# Patient Record
Sex: Female | Born: 1935 | Race: White | Hispanic: No | Smoking: Former smoker
Health system: Southern US, Community
[De-identification: ages and names within clinical notes are randomized; demographics above are authoritative.]

## PROBLEM LIST (undated history)

## (undated) ENCOUNTER — Emergency Department (HOSPITAL_COMMUNITY): Admission: EM | Payer: PRIVATE HEALTH INSURANCE | Source: Home / Self Care

## (undated) DIAGNOSIS — F329 Major depressive disorder, single episode, unspecified: Secondary | ICD-10-CM

## (undated) DIAGNOSIS — M1990 Unspecified osteoarthritis, unspecified site: Secondary | ICD-10-CM

## (undated) DIAGNOSIS — R296 Repeated falls: Secondary | ICD-10-CM

## (undated) DIAGNOSIS — J449 Chronic obstructive pulmonary disease, unspecified: Secondary | ICD-10-CM

## (undated) DIAGNOSIS — R0602 Shortness of breath: Secondary | ICD-10-CM

## (undated) DIAGNOSIS — I110 Hypertensive heart disease with heart failure: Secondary | ICD-10-CM

## (undated) DIAGNOSIS — I739 Peripheral vascular disease, unspecified: Secondary | ICD-10-CM

## (undated) DIAGNOSIS — I509 Heart failure, unspecified: Secondary | ICD-10-CM

## (undated) DIAGNOSIS — E039 Hypothyroidism, unspecified: Secondary | ICD-10-CM

## (undated) DIAGNOSIS — F0390 Unspecified dementia without behavioral disturbance: Secondary | ICD-10-CM

## (undated) DIAGNOSIS — I639 Cerebral infarction, unspecified: Secondary | ICD-10-CM

## (undated) DIAGNOSIS — J45909 Unspecified asthma, uncomplicated: Secondary | ICD-10-CM

## (undated) DIAGNOSIS — C4490 Unspecified malignant neoplasm of skin, unspecified: Secondary | ICD-10-CM

## (undated) DIAGNOSIS — Z9981 Dependence on supplemental oxygen: Secondary | ICD-10-CM

## (undated) DIAGNOSIS — F339 Major depressive disorder, recurrent, unspecified: Secondary | ICD-10-CM

## (undated) DIAGNOSIS — E7800 Pure hypercholesterolemia, unspecified: Secondary | ICD-10-CM

## (undated) DIAGNOSIS — E785 Hyperlipidemia, unspecified: Secondary | ICD-10-CM

## (undated) DIAGNOSIS — K219 Gastro-esophageal reflux disease without esophagitis: Secondary | ICD-10-CM

## (undated) DIAGNOSIS — I10 Essential (primary) hypertension: Secondary | ICD-10-CM

## (undated) DIAGNOSIS — I2510 Atherosclerotic heart disease of native coronary artery without angina pectoris: Secondary | ICD-10-CM

## (undated) DIAGNOSIS — M109 Gout, unspecified: Secondary | ICD-10-CM

## (undated) DIAGNOSIS — J309 Allergic rhinitis, unspecified: Secondary | ICD-10-CM

## (undated) DIAGNOSIS — G2581 Restless legs syndrome: Secondary | ICD-10-CM

## (undated) DIAGNOSIS — J189 Pneumonia, unspecified organism: Secondary | ICD-10-CM

## (undated) DIAGNOSIS — I209 Angina pectoris, unspecified: Secondary | ICD-10-CM

## (undated) DIAGNOSIS — R42 Dizziness and giddiness: Secondary | ICD-10-CM

## (undated) DIAGNOSIS — I5030 Unspecified diastolic (congestive) heart failure: Secondary | ICD-10-CM

## (undated) DIAGNOSIS — I219 Acute myocardial infarction, unspecified: Secondary | ICD-10-CM

## (undated) DIAGNOSIS — F3289 Other specified depressive episodes: Secondary | ICD-10-CM

## (undated) DIAGNOSIS — F32A Depression, unspecified: Secondary | ICD-10-CM

## (undated) HISTORY — DX: Major depressive disorder, single episode, unspecified: F32.9

## (undated) HISTORY — DX: Major depressive disorder, recurrent, unspecified: F33.9

## (undated) HISTORY — DX: Peripheral vascular disease, unspecified: I73.9

## (undated) HISTORY — PX: TUMOR EXCISION: SHX421

## (undated) HISTORY — DX: Unspecified osteoarthritis, unspecified site: M19.90

## (undated) HISTORY — DX: Restless legs syndrome: G25.81

## (undated) HISTORY — DX: Gout, unspecified: M10.9

## (undated) HISTORY — PX: TYMPANOPLASTY: SHX33

## (undated) HISTORY — DX: Hypertensive heart disease with heart failure: I11.0

## (undated) HISTORY — DX: Unspecified dementia, unspecified severity, without behavioral disturbance, psychotic disturbance, mood disturbance, and anxiety: F03.90

## (undated) HISTORY — PX: TONSILLECTOMY: SUR1361

## (undated) HISTORY — PX: CATARACT EXTRACTION W/ INTRAOCULAR LENS  IMPLANT, BILATERAL: SHX1307

## (undated) HISTORY — DX: Heart failure, unspecified: I50.9

## (undated) HISTORY — DX: Unspecified diastolic (congestive) heart failure: I50.30

## (undated) HISTORY — DX: Allergic rhinitis, unspecified: J30.9

## (undated) HISTORY — DX: Gastro-esophageal reflux disease without esophagitis: K21.9

## (undated) HISTORY — DX: Chronic obstructive pulmonary disease, unspecified: J44.9

## (undated) HISTORY — DX: Other specified depressive episodes: F32.89

## (undated) HISTORY — DX: Hyperlipidemia, unspecified: E78.5

## (undated) HISTORY — DX: Atherosclerotic heart disease of native coronary artery without angina pectoris: I25.10

## (undated) HISTORY — PX: SKIN CANCER EXCISION: SHX779

---

## 1998-04-11 ENCOUNTER — Inpatient Hospital Stay (HOSPITAL_COMMUNITY): Admission: RE | Admit: 1998-04-11 | Discharge: 1998-04-17 | Payer: Self-pay | Admitting: Neurosurgery

## 1998-04-11 ENCOUNTER — Encounter: Payer: Self-pay | Admitting: Neurosurgery

## 2000-01-02 ENCOUNTER — Encounter: Payer: Self-pay | Admitting: Cardiology

## 2000-01-02 ENCOUNTER — Encounter: Admission: RE | Admit: 2000-01-02 | Discharge: 2000-01-02 | Payer: Self-pay | Admitting: Cardiology

## 2000-07-01 ENCOUNTER — Other Ambulatory Visit: Admission: RE | Admit: 2000-07-01 | Discharge: 2000-07-01 | Payer: Self-pay | Admitting: *Deleted

## 2001-04-21 ENCOUNTER — Encounter: Payer: Self-pay | Admitting: Cardiology

## 2001-04-21 ENCOUNTER — Encounter: Admission: RE | Admit: 2001-04-21 | Discharge: 2001-04-21 | Payer: Self-pay | Admitting: Cardiology

## 2001-05-06 ENCOUNTER — Encounter: Payer: Self-pay | Admitting: Neurosurgery

## 2001-05-06 ENCOUNTER — Encounter: Admission: RE | Admit: 2001-05-06 | Discharge: 2001-05-06 | Payer: Self-pay | Admitting: Neurosurgery

## 2001-05-20 ENCOUNTER — Encounter: Payer: Self-pay | Admitting: Neurosurgery

## 2001-05-20 ENCOUNTER — Encounter: Admission: RE | Admit: 2001-05-20 | Discharge: 2001-05-20 | Payer: Self-pay | Admitting: Neurosurgery

## 2001-06-03 ENCOUNTER — Encounter: Payer: Self-pay | Admitting: Neurosurgery

## 2001-06-03 ENCOUNTER — Encounter: Admission: RE | Admit: 2001-06-03 | Discharge: 2001-06-03 | Payer: Self-pay | Admitting: Neurosurgery

## 2002-06-02 ENCOUNTER — Other Ambulatory Visit: Admission: RE | Admit: 2002-06-02 | Discharge: 2002-06-02 | Payer: Self-pay | Admitting: Obstetrics and Gynecology

## 2003-10-17 ENCOUNTER — Ambulatory Visit (HOSPITAL_COMMUNITY): Admission: RE | Admit: 2003-10-17 | Discharge: 2003-10-17 | Payer: Self-pay | Admitting: Cardiology

## 2004-04-01 ENCOUNTER — Encounter: Admission: RE | Admit: 2004-04-01 | Discharge: 2004-04-01 | Payer: Self-pay | Admitting: Cardiology

## 2006-02-12 ENCOUNTER — Encounter: Admission: RE | Admit: 2006-02-12 | Discharge: 2006-02-12 | Payer: Self-pay | Admitting: General Surgery

## 2006-02-28 ENCOUNTER — Encounter: Admission: RE | Admit: 2006-02-28 | Discharge: 2006-02-28 | Payer: Self-pay | Admitting: General Surgery

## 2006-05-14 ENCOUNTER — Inpatient Hospital Stay (HOSPITAL_COMMUNITY): Admission: EM | Admit: 2006-05-14 | Discharge: 2006-05-17 | Payer: Self-pay | Admitting: Emergency Medicine

## 2006-07-18 ENCOUNTER — Emergency Department (HOSPITAL_COMMUNITY): Admission: EM | Admit: 2006-07-18 | Discharge: 2006-07-18 | Payer: Self-pay | Admitting: Emergency Medicine

## 2006-08-05 ENCOUNTER — Inpatient Hospital Stay (HOSPITAL_COMMUNITY): Admission: EM | Admit: 2006-08-05 | Discharge: 2006-08-11 | Payer: Self-pay | Admitting: Emergency Medicine

## 2006-10-16 ENCOUNTER — Encounter (INDEPENDENT_AMBULATORY_CARE_PROVIDER_SITE_OTHER): Payer: Self-pay | Admitting: *Deleted

## 2006-10-16 ENCOUNTER — Ambulatory Visit (HOSPITAL_COMMUNITY): Admission: RE | Admit: 2006-10-16 | Discharge: 2006-10-16 | Payer: Self-pay | Admitting: *Deleted

## 2007-02-27 ENCOUNTER — Encounter: Admission: RE | Admit: 2007-02-27 | Discharge: 2007-02-27 | Payer: Self-pay | Admitting: Cardiology

## 2008-09-28 ENCOUNTER — Encounter: Admission: RE | Admit: 2008-09-28 | Discharge: 2008-09-28 | Payer: Self-pay | Admitting: Internal Medicine

## 2009-01-11 ENCOUNTER — Encounter
Admission: RE | Admit: 2009-01-11 | Discharge: 2009-03-02 | Payer: Self-pay | Admitting: Physical Medicine & Rehabilitation

## 2009-01-12 ENCOUNTER — Ambulatory Visit: Payer: Self-pay | Admitting: Physical Medicine & Rehabilitation

## 2009-01-26 ENCOUNTER — Ambulatory Visit: Payer: Self-pay | Admitting: Physical Medicine & Rehabilitation

## 2009-02-20 ENCOUNTER — Ambulatory Visit: Payer: Self-pay | Admitting: Physical Medicine & Rehabilitation

## 2009-04-05 ENCOUNTER — Encounter
Admission: RE | Admit: 2009-04-05 | Discharge: 2009-04-24 | Payer: Self-pay | Admitting: Physical Medicine & Rehabilitation

## 2009-04-06 ENCOUNTER — Ambulatory Visit: Payer: Self-pay | Admitting: Physical Medicine & Rehabilitation

## 2009-04-18 ENCOUNTER — Inpatient Hospital Stay (HOSPITAL_COMMUNITY): Admission: EM | Admit: 2009-04-18 | Discharge: 2009-04-24 | Payer: Self-pay | Admitting: Emergency Medicine

## 2009-04-20 ENCOUNTER — Encounter (INDEPENDENT_AMBULATORY_CARE_PROVIDER_SITE_OTHER): Payer: Self-pay | Admitting: Internal Medicine

## 2010-04-04 ENCOUNTER — Inpatient Hospital Stay (HOSPITAL_COMMUNITY): Admission: EM | Admit: 2010-04-04 | Discharge: 2010-04-06 | Payer: Self-pay | Source: Home / Self Care

## 2010-04-04 LAB — DIFFERENTIAL
Basophils Absolute: 0.1 10*3/uL (ref 0.0–0.1)
Basophils Relative: 1 % (ref 0–1)
Eosinophils Absolute: 0.3 10*3/uL (ref 0.0–0.7)
Eosinophils Relative: 4 % (ref 0–5)
Lymphs Abs: 2 10*3/uL (ref 0.7–4.0)

## 2010-04-04 LAB — CBC
Hemoglobin: 10.6 g/dL — ABNORMAL LOW (ref 12.0–15.0)
MCV: 87.7 fL (ref 78.0–100.0)
RBC: 3.9 MIL/uL (ref 3.87–5.11)
WBC: 8 10*3/uL (ref 4.0–10.5)

## 2010-04-04 LAB — POCT CARDIAC MARKERS
CKMB, poc: 1.5 ng/mL (ref 1.0–8.0)
Myoglobin, poc: 125 ng/mL (ref 12–200)
Troponin i, poc: <0.05 ng/mL (ref 0.00–0.09)
Troponin i, poc: <0.05 ng/mL (ref 0.00–0.09)

## 2010-04-04 LAB — POCT I-STAT, CHEM 8
Chloride: 104 mEq/L (ref 96–112)
HCT: 36 % (ref 36.0–46.0)
Hemoglobin: 12.2 g/dL (ref 12.0–15.0)

## 2010-04-04 LAB — LACTIC ACID, PLASMA: Lactic Acid, Venous: 1.9 mmol/L (ref 0.5–2.2)

## 2010-04-04 LAB — COMPREHENSIVE METABOLIC PANEL
Albumin: 4 g/dL (ref 3.5–5.2)
BUN: 18 mg/dL (ref 6–23)
CO2: 26 mEq/L (ref 19–32)
Chloride: 101 mEq/L (ref 96–112)
GFR calc Af Amer: 58 mL/min — ABNORMAL LOW (ref >60)
GFR calc non Af Amer: 48 mL/min — ABNORMAL LOW (ref >60)
Glucose, Bld: 108 mg/dL — ABNORMAL HIGH (ref 70–99)
Potassium: 4.5 mEq/L (ref 3.5–5.1)
Sodium: 137 mEq/L (ref 135–145)
Total Bilirubin: 0.3 mg/dL (ref 0.3–1.2)

## 2010-04-04 LAB — TYPE AND SCREEN: Antibody Screen: POSITIVE

## 2010-04-04 LAB — PROTIME-INR: INR: 0.91 (ref 0.00–1.49)

## 2010-04-05 LAB — BASIC METABOLIC PANEL
BUN: 13 mg/dL (ref 6–23)
CO2: 28 mEq/L (ref 19–32)
Chloride: 102 mEq/L (ref 96–112)
Creatinine, Ser: 0.94 mg/dL (ref 0.4–1.2)
GFR calc Af Amer: >60 mL/min (ref >60)
Glucose, Bld: 112 mg/dL — ABNORMAL HIGH (ref 70–99)

## 2010-04-18 NOTE — H&P (Signed)
NAME:  [name], [name]               ACCOUNT NO.:  [id number]  MEDICAL RECORD NO.:  [id number]          PATIENT TYPE:  EMS  LOCATION:  MAJO                         FACILITY:  [facility]  PHYSICIAN:  Almond Lint, MD       DATE OF BIRTH:  1926/10/08  DATE OF ADMISSION:  04/04/2010 DATE OF DISCHARGE:                             HISTORY & PHYSICAL   CHIEF COMPLAINT:  Fall, silver trauma  HISTORY OF PRESENT ILLNESS:  [name] is an 77 year old female who fell today while at home.  She had gone to the bathroom and left her walker there, which she normally does walk with.  She started going the opposite direction that she normally goes and picked up her speed and started running.  She was unable to stop and ran into the corner of the wall. Her son immediately heard her hit and came to check on her, and he noticed faint bruising on her face.  This occurred around noon.  After several hours, the bruising was dramatically worse and so they contacted their primary care physician.  This was Dr. Ronne Binning.  He saw her and got some facial films and referred her to the emergency department.  Her pain is 6/10.  She denies other complaint.  She denies loss of consciousness.  She does state she has had bilateral leg pain for several days to weeks.  PAST MEDICAL HISTORY:  Significant for coronary artery disease; hypertension; questionable COPD; diverticulosis; hypothyroid; dementia; and a history of pneumonia last February which required hospitalization.  PAST SURGICAL HISTORY:  Neck surgery and mastoid surgery.  SOCIAL HISTORY:  She is a former smoker.  Does not use drugs or alcohol. Lives with her son.  ALLERGIES:  None.  MEDICATIONS:  Allopurinol, Aricept, aspirin, Diflucan, Elocon, isosorbide mononitrate, Lasix, metoprolol, Neurontin, potassium chloride, prednisone, Prevacid, Prozac, Risperdal simvastatin, Spiriva, Synthroid, Tussionex, Valium, and Vicodin.  PRIMARY MD:  Dr.  Ronne Binning.  REVIEW OF SYSTEMS:  GENERAL:  Unable to be obtained accurately due to dementia.  She perseverates significantly. VITAL SIGNS:  Temperature 98.4, pulse 63, respiratory rate 14, blood pressure 142/52, sats are 98%. SKIN:  She has swelling and bruising over the bilateral face. HEENT:  Otherwise, normocephalic and atraumatic.  Eyes:  Pupils equal, round, and reactive to light.  The right eye does have some swelling, but is easily able to visualize the pupil.  Ears:  External ears are without trauma.  There is no blood in the auditory canal.  Face:  The mid face is swollen and tender. NECK:  Nontender. LUNGS:  Clear bilaterally, but decreased breath sounds throughout. There is no wheezing, rales, or rhonchi. HEART:  Regular rate and rhythm.  No murmurs. ABDOMEN:  Soft, nontender, nondistended. PELVIS:  Stable and nontender. MUSCULOSKELETAL:  Her right thigh is painful.  Otherwise, her extremities are warm and well-perfused, and she is able to straighten them.  Her back is nontender. NEURO:  She is not entirely oriented to the situation, but does redirect.  She does perseverate and she appears to have good motor and sensory function throughout.  LABS:  Sodium 134, potassium 4.5, chloride 104,  CO2 26, BUN 22, creatinine 1.3, glucose 104.  White count 8, hemoglobin/hematocrit 10.6 and 34.2, platelets 193,000.  Chest x-ray shows left basilar atelectasis.  Pelvis is negative for fracture.  CT scan of  brain is negative for intracranial abnormality.  Neck is negative for fracture.  Face shows right orbital floor fracture, right lateral maxillary wall fracture, subcutaneous air in the face and left face.  Of note, the cervical spine did show advanced degenerative cervical spondylosis and kyphosis with degenerative anterolisthesis at C3-4 and C4-5.  Of note, with the facial CT, the globe on the right is intact although there is right orbital emphysema.  The chest shows  nondisplaced left 8th and 9th rib fracture, and the left 9th rib appears to have a possible lytic lesion.  The abdomen and pelvis shows no traumatic findings, diverticulosis, and there are benign calcifications in the liver.  CONSULTANTS:  We will consult ENT for a non-urgent consult.  ASSESSMENT AND PLAN:  [name] is an 77 year old female status post fall with left 8th rib fracture and 9th rib fracture, questionable lytic lesion in the left rib and facial fractures with air in the right orbit and bilateral face.  She will be admitted to the hospital for evaluation.  We will recheck right rib films in the a.m. and check a right femur film.  We will have an ENT consult, pulmonary toilet, and pain control.     Almond Lint, MD     FB/MEDQ  D:  04/04/2010  T:  04/05/2010  Job:  [id number]  Electronically Signed by Almond Lint MD on 04/18/2010 12:17:11 PM

## 2010-04-26 NOTE — Discharge Summary (Signed)
[name], [name]               ACCOUNT NO.:  [id number]  MEDICAL RECORD NO.:  [id number]          PATIENT TYPE:  INP  LOCATION:  5121                         FACILITY:  [facility]  PHYSICIAN:  Wilmon Arms. Corliss Skains, M.D. DATE OF BIRTH:  November 19, 1926  DATE OF ADMISSION:  04/04/2010 DATE OF DISCHARGE:  04/06/2010                              DISCHARGE SUMMARY   DISCHARGE DIAGNOSES: 1. Fall. 2. Right orbit maxillary sinus fractures. 3. Left ninth rib fracture. 4. Dementia. 5. Coronary artery disease. 6. Hypertension. 7. Chronic obstructive pulmonary disease. 8. Diverticulosis. 9. Hypothyroidism. 10.Dementia. 11.Gastroesophageal reflux disease. 12.Dyslipidemia.  CONSULTANTS:  Dr. Annalee Genta for Head and Neck Surgery.  PROCEDURES:  None.  HISTORY OF PRESENT ILLNESS:  This is an 77 year old white female who was at home.  She left her walker in the bathroom and was walking without it.  For some reason, she peaked up speed and started running and was unable to stop and ran into the corner of the wall and fell.  When the bruising on her face became rather traumatic, they brought her in to see her primary care provider.  Some facial films there prompted transfer to the emergency department where the facial fractures were diagnosed and a possible left rib fracture was identified.  She was admitted for head and neck surgery consults and observation.  HOSPITAL COURSE:  Dr. Annalee Genta saw the patient and recommended no nose blowing, but no further treatment was required.  Followup in his office is going to be on an as-needed basis.  Her medical problems all remained stable while she was here and she was able to be mobilized with physical therapy who agreed with discharge back home with the son with 24-hour supervision.  The son is in agreement with this and she was transferred there in good condition in his care.  DISCHARGE MEDICATIONS:  No new medications were added during this admission.   She may resume all of her home medications which include: 1. Albuterol neb twice daily as needed for shortness of breath. 2. Allopurinol 100 mg daily. 3. Aspirin 325 mg daily. 4. Aricept 5 mg daily. 5. Diflucan 150 mg daily. 6. Lasix 40 mg daily. 7. Metoprolol 12.5 mg twice daily. 8. Neurontin 300 mg 3 times daily. 9. Prevacid 30 mg daily. 10.Prozac 20 mg daily. 11.Risperdal 1 mg daily at bedtime. 12.Simvastatin 10 mg daily at bedtime. 13.Spiriva 18 mcg inhaled daily. 14.Synthroid 50 mcg daily. 15.Vitamin D2 50,000 international units by mouth twice daily. 16.Valium 5 mg by mouth daily as needed for nerves. 17.Vicodin ES 7.5/750 one tablet by mouth 4 times daily as needed for     pain.  FOLLOWUP:  The patient will follow up with her primary care provider. Follow up with both Dr. Annalee Genta and the Trauma Service will be on an as-needed basis.     Earney Hamburg, P.A.   ______________________________ Wilmon Arms. Corliss Skains, M.D.    MJ/MEDQ  D:  04/06/2010  T:  04/06/2010  Job:  [id number]  cc:   Dr. Ronne Binning  Electronically Signed by Charma Igo P.A. on 04/26/2010 11:06:19 AM Electronically Signed by Manus Rudd M.D. on 04/26/2010  06:36:56 PM

## 2010-04-27 NOTE — Consult Note (Signed)
  NAMEVERNADINE, [name]               ACCOUNT NO.:  [id number]  MEDICAL RECORD NO.:  [id number]          PATIENT TYPE:  INP  LOCATION:  5121                         FACILITY:  [facility]  PHYSICIAN:  Kinnie Scales. Annalee Genta, M.D.DATE OF BIRTH:  04/19/1926  DATE OF CONSULTATION:  04/05/2010 DATE OF DISCHARGE:                                CONSULTATION   BRIEF HISTORY:  The patient is an 77 year old white female who presented to the [facility] Emergency Department after suffering a fall. She struck her face and chest wall and was admitted via the Trauma Service for observation and management of her injuries.  There was no loss of consciousness at the time of her injury.  Minimal nasal bleeding and moderate amount of right facial swelling with some minimal pain. [name] dentures were intact.  There was no diplopia and no significant change over the first 12 hours of her admission.  A CT maxillofacial scan was performed and was reviewed in detail that showed nondisplaced fractures involving the right inferior orbital wall and right lateral maxillary sinus wall.  No significant fluid or blood within the maxillary sinus.  There was a moderate amount of soft tissue swelling and some emphysema overlying the right cheek.  No other facial injuries, fractures, or abnormalities were identified with the exception of a deviated nasal septum, which appears to be old in nature.  PHYSICAL EXAMINATION:  The patient is an 77 year old white female, mildly disoriented, but alert and awake.  She has a moderate amount of right facial swelling with ecchymosis involving the right cheek and periorbital region.  The extraocular mobility is intact with no evidence of diplopia or entrapment.  She is minimally tender over the right inferior orbital rim.  There was no evidence of palpable fracture.  Oral cavity shows no significant swelling, but ecchymosis within the right buccal space.  [name] upper  and lower dentures fit appropriately and there was no evidence of jaw fracture.  Normal mobility.  IMPRESSION: 1. Fall with multiple injuries. 2. Right facial trauma. 3. Nondisplaced right facial fractures with soft tissue ecchymosis and     emphysema.  ASSESSMENT AND PLAN:  The patient has evaluated in the hospital after suffering facial injuries.  Recommend fracture precautions including elevate head of bed, no nose blowing, frequent saline nasal spray and avoiding any additional trauma to the involved area.  Based on her history, examination and CT scan, it is unlikely that any surgical intervention will be required.  The fractures are nondisplaced and appears stable in nature.  Recommend follow up as outpatient as needed.  [name] symptoms will gradually diminish over the next several weeks, but would be glad to see her as an outpatient for further evaluation and workup or if there any significant change in her symptoms.          ______________________________ Kinnie Scales Annalee Genta, M.D.     DLS/MEDQ  D:  98/01/9146  T:  04/06/2010  Job:  [id number]  Electronically Signed by Osborn Coho M.D. on 04/27/2010 04:49:29 PM

## 2010-05-30 LAB — BLOOD GAS, ARTERIAL
FIO2: 0.28 %
O2 Content: 2 L/min
Patient temperature: 98.6
pH, Arterial: 7.44 — ABNORMAL HIGH (ref 7.350–7.400)

## 2010-05-30 LAB — BASIC METABOLIC PANEL
BUN: 11 mg/dL (ref 6–23)
BUN: 4 mg/dL — ABNORMAL LOW (ref 6–23)
BUN: 5 mg/dL — ABNORMAL LOW (ref 6–23)
CO2: 24 mEq/L (ref 19–32)
Calcium: 9.1 mg/dL (ref 8.4–10.5)
Chloride: 108 mEq/L (ref 96–112)
Chloride: 114 mEq/L — ABNORMAL HIGH (ref 96–112)
Chloride: 115 mEq/L — ABNORMAL HIGH (ref 96–112)
Creatinine, Ser: 0.71 mg/dL (ref 0.4–1.2)
Creatinine, Ser: 0.83 mg/dL (ref 0.4–1.2)
Creatinine, Ser: 0.85 mg/dL (ref 0.4–1.2)
GFR calc Af Amer: >60 mL/min (ref >60)
GFR calc non Af Amer: >60 mL/min (ref >60)
GFR calc non Af Amer: >60 mL/min (ref >60)
Glucose, Bld: 108 mg/dL — ABNORMAL HIGH (ref 70–99)
Glucose, Bld: 97 mg/dL (ref 70–99)
Potassium: 4.1 mEq/L (ref 3.5–5.1)

## 2010-05-30 LAB — URINE MICROSCOPIC-ADD ON

## 2010-05-30 LAB — CBC
HCT: 27.9 % — ABNORMAL LOW (ref 36.0–46.0)
HCT: 34.5 % — ABNORMAL LOW (ref 36.0–46.0)
Hemoglobin: 9.6 g/dL — ABNORMAL LOW (ref 12.0–15.0)
MCHC: 33.4 g/dL (ref 30.0–36.0)
MCHC: 33.6 g/dL (ref 30.0–36.0)
MCHC: 34.1 g/dL (ref 30.0–36.0)
MCV: 87.3 fL (ref 78.0–100.0)
MCV: 87.5 fL (ref 78.0–100.0)
MCV: 87.6 fL (ref 78.0–100.0)
MCV: 87.6 fL (ref 78.0–100.0)
MCV: 87.7 fL (ref 78.0–100.0)
Platelets: 121 10*3/uL — ABNORMAL LOW (ref 150–400)
Platelets: 126 10*3/uL — ABNORMAL LOW (ref 150–400)
Platelets: 160 10*3/uL (ref 150–400)
Platelets: 197 10*3/uL (ref 150–400)
RBC: 3.06 MIL/uL — ABNORMAL LOW (ref 3.87–5.11)
RBC: 3.18 MIL/uL — ABNORMAL LOW (ref 3.87–5.11)
RBC: 3.19 MIL/uL — ABNORMAL LOW (ref 3.87–5.11)
RBC: 3.94 MIL/uL (ref 3.87–5.11)
RDW: 14.6 % (ref 11.5–15.5)
RDW: 14.8 % (ref 11.5–15.5)
WBC: 11.3 10*3/uL — ABNORMAL HIGH (ref 4.0–10.5)
WBC: 16.2 10*3/uL — ABNORMAL HIGH (ref 4.0–10.5)
WBC: 6.9 10*3/uL (ref 4.0–10.5)
WBC: 7.7 10*3/uL (ref 4.0–10.5)
WBC: 8.6 10*3/uL (ref 4.0–10.5)

## 2010-05-30 LAB — DIFFERENTIAL
Basophils Relative: 0 % (ref 0–1)
Eosinophils Absolute: 0.1 10*3/uL (ref 0.0–0.7)
Eosinophils Absolute: 0.2 10*3/uL (ref 0.0–0.7)
Eosinophils Relative: 1 % (ref 0–5)
Eosinophils Relative: 3 % (ref 0–5)
Lymphocytes Relative: 18 % (ref 12–46)
Lymphs Abs: 1.1 10*3/uL (ref 0.7–4.0)
Lymphs Abs: 1.2 10*3/uL (ref 0.7–4.0)
Monocytes Absolute: 0.4 10*3/uL (ref 0.1–1.0)
Monocytes Absolute: 0.7 10*3/uL (ref 0.1–1.0)
Monocytes Relative: 4 % (ref 3–12)

## 2010-05-30 LAB — CARDIAC PANEL(CRET KIN+CKTOT+MB+TROPI)
CK, MB: 1.9 ng/mL (ref 0.3–4.0)
Relative Index: 0.9 (ref 0.0–2.5)
Relative Index: 7.6 — ABNORMAL HIGH (ref 0.0–2.5)
Relative Index: 8.3 — ABNORMAL HIGH (ref 0.0–2.5)
Total CK: 183 U/L — ABNORMAL HIGH (ref 7–177)
Total CK: 215 U/L — ABNORMAL HIGH (ref 7–177)
Troponin I: 1.54 ng/mL (ref 0.00–0.06)
Troponin I: 1.87 ng/mL (ref 0.00–0.06)

## 2010-05-30 LAB — COMPREHENSIVE METABOLIC PANEL
ALT: 15 U/L (ref 0–35)
AST: 19 U/L (ref 0–37)
Albumin: 2.9 g/dL — ABNORMAL LOW (ref 3.5–5.2)
Calcium: 8.9 mg/dL (ref 8.4–10.5)
Chloride: 112 mEq/L (ref 96–112)
Creatinine, Ser: 0.84 mg/dL (ref 0.4–1.2)
GFR calc Af Amer: >60 mL/min (ref >60)
Sodium: 141 mEq/L (ref 135–145)
Total Bilirubin: 0.5 mg/dL (ref 0.3–1.2)

## 2010-05-30 LAB — TSH: TSH: 0.712 u[IU]/mL (ref 0.350–4.500)

## 2010-05-30 LAB — URINALYSIS, ROUTINE W REFLEX MICROSCOPIC
Bilirubin Urine: NEGATIVE
Glucose, UA: NEGATIVE mg/dL
Hgb urine dipstick: NEGATIVE
Protein, ur: NEGATIVE mg/dL
Urobilinogen, UA: 0.2 mg/dL (ref 0.0–1.0)

## 2010-05-30 LAB — CK TOTAL AND CKMB (NOT AT ARMC): CK, MB: 1.8 ng/mL (ref 0.3–4.0)

## 2010-05-30 LAB — BRAIN NATRIURETIC PEPTIDE
Pro B Natriuretic peptide (BNP): 1493 pg/mL — ABNORMAL HIGH (ref 0.0–100.0)
Pro B Natriuretic peptide (BNP): 319 pg/mL — ABNORMAL HIGH (ref 0.0–100.0)
Pro B Natriuretic peptide (BNP): 653 pg/mL — ABNORMAL HIGH (ref 0.0–100.0)

## 2010-05-30 LAB — POCT I-STAT, CHEM 8
Calcium, Ion: 1.25 mmol/L (ref 1.12–1.32)
Creatinine, Ser: 1.6 mg/dL — ABNORMAL HIGH (ref 0.4–1.2)
Glucose, Bld: 113 mg/dL — ABNORMAL HIGH (ref 70–99)
Hemoglobin: 11.6 g/dL — ABNORMAL LOW (ref 12.0–15.0)
TCO2: 30 mmol/L (ref 0–100)

## 2010-05-30 LAB — URINE CULTURE

## 2010-05-30 LAB — CULTURE, BLOOD (ROUTINE X 2): Culture: NO GROWTH

## 2010-07-24 NOTE — Op Note (Signed)
NAMESHRIKA, MILOS               ACCOUNT NO.:  [id number]   MEDICAL RECORD NO.:  [id number]          PATIENT TYPE:  AMB   LOCATION:  ENDO                         FACILITY:  [facility]   PHYSICIAN:  Georgiana Spinner, M.D.    DATE OF BIRTH:  26-Nov-1926   DATE OF PROCEDURE:  10/16/2006  DATE OF DISCHARGE:                               OPERATIVE REPORT   PROCEDURE:  Upper endoscopy.   INDICATIONS:  Gastroesophageal reflux disease, hemoccult positivity.   ANESTHESIA:  Demerol 40 mg, Versed 4 mg.   PROCEDURE:  With the patient mildly sedated in the left lateral  decubitus position, Pentax videoscopic endoscope was inserted in the  mouth, passed under direct vision through the esophagus which appeared  normal into the stomach.  Fundus, body, antrum appeared normal as did  duodenal bulb second portion duodenum.  From this point the endoscope  was slowly withdrawn taking circumferential views of duodenal mucosa  until the endoscope had been pulled back in the stomach placed in  retroflexion to view the stomach from below.  The endoscope was  straightened and withdrawn taking circumferential views of the remaining  gastric and esophageal mucosa.  [name] vital signs, pulse  oximeter remained stable.  The patient tolerated procedure well without  apparent complications.   FINDINGS:  Negative exam.   PLAN:  Proceed to colonoscopy.           ______________________________  Georgiana Spinner, M.D.     GMO/MEDQ  D:  10/16/2006  T:  10/16/2006  Job:  [id number]

## 2010-07-24 NOTE — Discharge Summary (Signed)
[name], [name]               ACCOUNT NO.:  [id number]   MEDICAL RECORD NO.:  [id number]          PATIENT TYPE:  INP   LOCATION:  5727                         FACILITY:  [facility]   PHYSICIAN:  Lonia Blood, M.D.DATE OF BIRTH:  [birth year]   DATE OF ADMISSION:  08/05/2006  DATE OF DISCHARGE:                               DISCHARGE SUMMARY   DATE OF DISCHARGE:  To be determined.   PRIMARY CARE PHYSICIAN:  Dr. Aggie Cosier   DISCHARGE DIAGNOSES:  1. Right lower lobe community acquired pneumonia.  2. Asthmatic bronchitis.  3. Dementia with hallucinations.  4. Iron deficiency anemia - outpatient GI evaluation and close      following recommended.  5. Hypothyroidism on Synthroid therapy.  6. Chronic osteoarthritis.  7. Major depression.  8. ALLERGY TO PREDNISONE.   DISCHARGE MEDICATIONS:  1. Claritin 10 mg p.o. once a day.  2. Aricept 5 mg p.o. once daily at bedtime.  3. Prozac 20 mg p.o. once a day.  4. Lasix 40 mg p.o. once a day.  5. Synthroid 50 mcg p.o. once a day.  6. Protonix 40 mg p.o. once a day.  7. Multivitamin 1 p.o. once a day.  8. New iron 150 mg p.o. once a day.  9. Lopressor 12.5 mg p.o. b.i.d.  10.Albuterol inhaler 1 puff t.i.d.  11.Spiriva 18 mcg inhaled daily.  12.Risperdal 1 mg p.o. once daily at bedtime.  13.Avelox 400 mg p.o. once a day x5 days then discontinue.  14.Tylenol 650 mg p.o. q.4h. p.r.n.   FOLLOW UP:  The patient is to follow up with Dr. Aggie Cosier in  approximately 7-10 days.  At that time the patient will need to be  simply examined to assure that her respiratory status remains stable.  Close monitoring of her iron deficiency anemia is also recommended with  consideration being given to outpatient colonoscopy for screening  purposes for colon cancer.   CONSULTATIONS:  None.   PROCEDURES:  None.   HOSPITAL COURSE:  [name] is a pleasant 77 year old female who  suffers with mild dementia.  She presented to hospital on  Aug 05, 2006  with complaints of severe shortness of breath, chest tightness and  significant sputum production.  Exam revealed significant bronchospasm.  Chest x-ray revealed a right lower lobe community acquired pneumonia.  The patient was empirically treated with IV antibiotics.  Bronchodilators were administered.  The patient improved very nicely and  quite rapidly.  No significant further complications were appreciated.  The patient was able to be titrated to a standing dose of beta agonist  plus long acting anticholinergic therapy.  She tolerated this well.  She  is to complete a course of p.o. antibiotics to resolve her pneumonia.   During this hospital stay a significant anemia was appreciated.  Hemoglobin was stable around 10.1 with an MCV of 80.  Iron studies were  obtained revealing normal total iron binding capacity and low percent  SAT.  Ferritin was obtained and was significantly low at 14.  This is  consistent with a significant iron deficiency anemia.   Iron  therapy was initiated.  Stool guaiac's were requested but were not  accomplished during the hospital stay.  It is not felt that prolonging  the patient's inpatient stay for GI evaluation is appropriate at this  time.  Monitoring of the patient's iron deficiency anemia with  consideration to an outpatient screening colonoscopy should be paid.   At the time of admission it was noted that the patient was suffering  with a significant dementia.  She was being treated already with Aricept  and Risperdal which she was tolerating well.  Risperdal was decreased in  its dose slightly due to some significant difficulty with somnolence.  The patient tolerated this without significant agitation.  After  discussion with the patient's son, it was determined that the most  appropriate placement for this patient would be an assisted living  facility in a minimum care unit.  Physical therapy and occupational  therapy agreed that  this level of care would be appropriate for this  patient.  At the present time the patient is pending placement in an  assisted living facility.  She is medically stable and ready for  discharge.      Lonia Blood, M.D.  Electronically Signed     JTM/MEDQ  D:  08/11/2006  T:  08/11/2006  Job:  [id number]   cc:   [name], M.D.

## 2010-07-24 NOTE — Op Note (Signed)
NAME:  [name], [name]               ACCOUNT NO.:  [id number]   MEDICAL RECORD NO.:  [id number]          PATIENT TYPE:  AMB   LOCATION:  ENDO                         FACILITY:  [facility]   PHYSICIAN:  Georgiana Spinner, M.D.    DATE OF BIRTH:  30-Nov-1926   DATE OF PROCEDURE:  10/16/2006  DATE OF DISCHARGE:                               OPERATIVE REPORT   PROCEDURE:  Colonoscopy.   INDICATIONS:  Hemoccult positivity.   ANESTHESIA:  Demerol 10 mg, Versed 2 mg.   PROCEDURE:  With the patient mildly sedated in the left lateral  decubitus position, the Pentax videoscopic colonoscope was inserted in  the rectum and passed under direct vision to the cecum identified by  ileocecal valve and appendiceal orifice both of which were photographed.  From this point the colonoscope was slowly withdrawn taking  circumferential views of colonic mucosa as we withdrew all the way to  the rectum stopping first in the ascending colon where a polyp seen  photographed and removed using snare cautery technique setting of 20/150  blended current.  There was a small amount of blood or tissue in the  center of this polyp that we used hot biopsy forceps to eradicate, so  subsequently from this point the colonoscope was withdrawn taking  circumferential views of the remaining colonic mucosa stopping only in  the rectum where a second polyp similar size was photographed and  removed using snare cautery technique with the same setting. Tissue  again was retrieved for pathology by suctioning through the scope into a  tissue trap.  In the rectum the endoscope was placed in retroflexed view  to show the anal canal from above.  The endoscope was straightened and  withdrawn.  [name] vital signs, pulse oximeter remained stable.  The patient tolerated procedure well without apparent complications.   FINDINGS:  Polyps of ascending colon and rectosigmoid area.  Internal  hemorrhoids, otherwise unremarkable exam.   PLAN:   Await biopsy reports.  The patient will call me for results and  follow-up with me as an outpatient.           ______________________________  Georgiana Spinner, M.D.     GMO/MEDQ  D:  10/16/2006  T:  10/16/2006  Job:  [id number]

## 2010-07-24 NOTE — H&P (Signed)
NAMEGLADYCE, [name]               ACCOUNT NO.:  [id number]   MEDICAL RECORD NO.:  [id number]          PATIENT TYPE:  INP   LOCATION:  1848                         FACILITY:  [facility]   PHYSICIAN:  [name], M.D. DATE OF BIRTH:  1927-03-02   DATE OF ADMISSION:  08/05/2006  DATE OF DISCHARGE:                              HISTORY & PHYSICAL   PRIMARY CARE PHYSICIAN:  Kristi Prime. Lucas Mallow, MD.   CHIEF COMPLAINT:  Shortness of breath.   HISTORY OF PRESENT ILLNESS:  [name] is a 77 year old female with a  past medical history of bronchopneumonia.  She indicates that yesterday  she developed shortness of breath.  Her shortness of breath progressed  from yesterday up until today.  She tried to use her inhalers, however,  she got no symptom relief from the inhalers.  She began to developed  chest tightness along the central region of her chest in association  with her shortness of breath.  She also has developed a cough with  occasional sputum production.  She denies having any fevers, no nausea  or emesis.  She decided to come to the hospital for further evaluation.   PAST MEDICAL HISTORY:  1. Bronchopneumonia.  2. Hypercalcemia.  3. Arthritis.  4. Chronic back pain.  5. Asthma.  6. Mild dementia.  7. Acute asthmatic bronchitis.  8. Acute maxillary sinusitis.  9. Major depression.  10.Hypothyroidism.  11.Anemia.   ALLERGIES:  PREDNISONE.   CURRENT MEDICATIONS:  1. Risperdal 2 mg q.h.s.  2. Aricept 5 mg q.h.s.  3. Clarinex 5 mg daily.  4. Prozac 20 mg daily.  5. Vicodin 5/500 p.r.n.  6. Prevacid 30 mg daily.  7. Metoprolol 50 mg a half a tablet b.i.d.  8. Quinine sulfate 200 mg q.h.s. p.r.n.  9. Lasix 40 mg daily.  10.Magnesium oxide 400 mg b.i.d.  11.Diflucan 150 mg by mouth one time a week.  12.Synthroid 0.05 mg q.h.s.  13.Fluoxetine 20 mg daily.  14.Diphenhydramine 50 mg by mouth daily.   PAST SURGICAL HISTORY:  1. Total abdominal hysterectomy secondary to  tumor.  2. Mastoid bone surgery x2.   SOCIAL HISTORY:  Cigarettes:  The patient stopped smoking approximately  six years ago.  She started smoking at the age of 77.  Alcohol:  The  patient denies alcohol since 1968.  She had a remote history of alcohol  abuse and has attended AA in the past.   FAMILY HISTORY:  Mother had Alzheimer's dementia, father had an MI, two  brothers had an MI, one sister died from a brain tumor.   REVIEW OF SYSTEMS:  As per HPI.   PHYSICAL EXAMINATION:  GENERAL:  The patient is awake, she is  cooperative, she does not appear to be in any obvious distress.  VITAL SIGNS:  Her temperature is 96.9, blood pressure 120/64, heart rate  69, respirations 24, O2 SAT 89% on room air.  HEENT:  Atraumatic, normocephalic, anicteric, extraocular movements are  intact, oral mucosa is pink, no thrush.  NECK:  No JVD, supple, no lymphadenopathy, no thyromegaly.  CARDIAC:  S1 S2 present, regular rate  and rhythm.  RESPIRATORY:  Loud bilateral wheezes are auscultated throughout both the  lungs.  ABDOMEN:  Soft, flat, nontender, and nondistended.  EXTREMITIES:  No leg edema.  NEUROLOGIC:  The patient is alert and oriented x3.  MUSCULOSKELETAL:  5/5 upper and lower extremity strength.   CBC:  White blood cell count 7.0, hemoglobin 10.8, hematocrit 33.3,  platelet count 205, pH 7.543, pCO2 28.9, bicarb 24.8, hemoglobin 10.9,  hematocrit 32.0, sodium 136, potassium 4.0, chloride 106, glucose 94,  BUN 9, creatinine 1.0.  Chest x-ray reveals suspicious appearance for  bronchopneumonia involving the right lower lobe, chronic bronchitic  changes, questionable mild vascular congestion, minimal atelectasis  versus scarring at the right base.   ASSESSMENT AND PLAN:  1. Pneumonia:  Will start the patient on empiric intravenous      antibiotics.  Will order a sputum culture for gram stain and      sensitivity.  Will also follow up the results of the patient's      blood cultures.  Will  provide nebulized breathing treatments as      well as supplemental oxygen.  2. History of depression:  Will resume the patient's prior home      medications.  3. History of hypothyroidism:  Will check a TSH, T4, and resume the      patient's prior dose of Synthroid.  4. Anemia:  Will order iron studies as well as stool for occult blood.  5. Gastrointestinal prophylaxis:  Will provide Protonix.  6. Deep venous thrombosis prophylaxis:  Will provide (SCD's)      sequential compression devices for now.      [name], M.D.  Electronically Signed     OR/MEDQ  D:  08/05/2006  T:  08/05/2006  Job:  [id number]

## 2010-07-27 NOTE — Consult Note (Signed)
[name], [name]               ACCOUNT NO.:  [id number]   MEDICAL RECORD NO.:  [id number]          PATIENT TYPE:  INP   LOCATION:  5708                         FACILITY:  [facility]   PHYSICIAN:  Antonietta Breach, M.D.  DATE OF BIRTH:  [birth year]   DATE OF CONSULTATION:  05/15/2006  DATE OF DISCHARGE:  05/17/2006                                 CONSULTATION   REASON FOR CONSULTATION:  Depression.   REQUESTING PHYSICIAN:  Dr. Michaelyn Barter.   HISTORY OF PRESENT ILLNESS:  [name] is a 77 year old female  admitted to the [facility] on May 13, 2006 for shortness of  breath.   [name] developed a productive cough of green phlegm.  She developed  confusion and agitation.  Her daughter reports that for several weeks  she has had on and off agitation after sundown.  She has also displayed  some slight decrease in short-term memory.  She has had several weeks of  depressed mood, decreased energy, decreased concentration and much  irritability.  She has not had thoughts of harming herself or others.  She has not had any delusions or hallucinations.   PAST PSYCHIATRIC HISTORY:  [name] daughter reports that she has  suffered from depression of the similar symptoms as above for many  years.  She also has a history of feeling on edge and excess worry.  She  has been treated with Valium in the past.   She was placed on Prozac 20 mg daily a number of years back with no  benefit because the patient rarely took the medicine.   [name] husband passed away a number of years ago and that was  when her depression worsened.   FAMILY PSYCHIATRIC HISTORY:  [name] mother reportedly had  Alzheimer's disease.   SOCIAL HISTORY:  The patient has two children-one daughter is attentive  at the bedside.  The patient used to drink alcohol excessively and  attended AA.  She denies having consumed alcohol for several years.  She  does not use illegal drugs.   Occupationally she is retired.  She is  widowed.  She stopped smoking cigarettes approximately 5 years ago.  [employer].   GENERAL MEDICAL PROBLEMS:  1. Bronchopneumonia.  2. Arthritis.  3. Back pain.  4. Asthma.   SURGICAL HISTORY:  1. Total abdominal hysterectomy.  2. Mastoid bone surgery twice.   ALLERGIES:  NETRELATE.   LABORATORY DATA:  WBC 6.7, hemoglobin 11.2, platelet count 157.  The  metabolic panel is reviewed.  The BUN is 15, creatinine 0.77.  SGOT 23,  SGPT 16, calcium 9.2, magnesium 2.0.  Thyroid stimulating hormone 1.016.   MEDICATIONS:  The MAR is reviewed.  The patient has been placed on  Prozac 20 mg daily.  Please see the above discussion.  This medicine was  just started in the hospital.   Head CT without contrast on March 6th shows no acute intracranial  finding.  The patient does show CT evidence of ethmoidal and maxillary  acute sinusitis with air-fluid levels.   REVIEW OF SYSTEMS:  CONSTITUTIONAL:  Afebrile.  HEAD:  No trauma.  EYES:  No visual changes.  EARS:  No hearing impairment.  NOSE:  No rhinorrhea.  MOUTH/THROAT:  No sore throat.  NEUROLOGIC:  As above.  PSYCHIATRIC:  As  above.  CARDIOVASCULAR:  No chest pain, palpitations or edema.  RESPIRATORY:  As above.  GASTROINTESTINAL:  No nausea, vomiting,  diarrhea.  GENITOURINARY:  No dysuria.  SKIN:  Unremarkable.  MUSCULOSKELETAL:  No deformities.  ENDOCRINE/METABOLIC:  Unremarkable.  HEMATOLOGY/LYMPHATIC:  Mild anemia.   EXAMINATION:  VITAL SIGNS:  Temperature 97.9, pulse 66, respiration 20,  blood pressure 121/65, O2 saturation on 2 liters 94%.   MENTAL STATUS EXAM:  [name] is an elderly female appearing  her chronologic age, partially reclined in a supine position with good  eye contact.  She is well-groomed.  She does have intact orientation to  all spheres.  Her memory recall is 3/3 immediate, 2/3 at 3 minutes.  Her  eye contact is good.  Her speech involves normal rate and  prosody.  Affect is constricted with occasional appropriate tears.  Mood is  depressed.  Her fund of knowledge and intelligence are slightly below  that of her estimated premorbid baseline on thought process.  She has  occasional confabulation.  Thought content:  No thoughts of harming  herself, no thoughts of harming others, no delusions, no hallucinations.  Concentration is mildly decreased.  Judgment is grossly intact.  Her  insight is intact for her depression.  She is socially appropriate and  cooperative with the interview.   ASSESSMENT:  AXIS I:  1. Mood disorder, 293.83, not otherwise specified, depressed      (functional and general medical factors).  2. Anxiety disorder, 293.84, not otherwise specified.  3. Delirium, 293.00, now resolved.  4. Unspecified persistent mental disorder, 294.9, not otherwise      specified.  This category is referring to the slight short-term      memory impairment reported over the past several weeks.  This may      represent a temporary symptom secondary to acute general medical      factors.  AXIS II:  Deferred.  AXIS III:  See general medical problems.  AXIS IV:  General medical.  AXIS V:  55.   Ego supportive psychotherapy and education were provided at the  patient's request.  The undersigned also discussed treatment with the  patient's daughter because the patient could not process some of the  details regarding indications of different psychotropics. The  indications, alternatives and adverse effects of the following agents  were discussed with the patient's daughter:  Celexa, Depakote and  Seroquel.  [name] daughter understands the above information and  would like to proceed as follows.   RECOMMENDATIONS:  1. Because Celexa has a more optimal cytochrome P450 isoenzyme      distribution in the face of multiple medications, would start      Celexa 10 mg q.a.m. and then increase to 20 mg q.a.m. at 4 days as     tolerated for  antidepression, antianxiety.  Would try to minimize      the use of benzodiazepines and eventually eliminate them as the      Celexa takes affect.  2. If the patients irritability takes the form of severe agitation      which is not resolved with SSRI treatment, would then start      Depakote at 250 mg nightly and increase as tolerated to 500 mg ER  p.o. q.p.m. as the initial trial dose would check a CBC and liver      function panel periodically to screen for Depakote adverse effects.  3. If the patient resumes with hallucinations, delusions, confusion,      would start Seroquel for antipsychosis at 25 mg q.p.m. and then      increase by 25-50 mg per day to an estimated trial dose of 100 mg      nightly.  As mentioned above, would not use Seroquel unless      agitation includes hallucinations.  4. If the patient continues with some short-term memory difficulty and      the reversible memory dysfunction etiology workup is negative,      would consider Aricept and/or Namenda.      Antonietta Breach, M.D.  Electronically Signed     JW/MEDQ  D:  05/16/2006  T:  05/17/2006  Job:  [id number]

## 2010-07-27 NOTE — H&P (Signed)
NAME:  [name], [name]               ACCOUNT NO.:  [id number]   MEDICAL RECORD NO.:  [id number]          PATIENT TYPE:  EMS   LOCATION:  MAJO                         FACILITY:  [facility]   PHYSICIAN:  Michaelyn Barter, M.D. DATE OF BIRTH:  [birth year]   DATE OF ADMISSION:  05/13/2006  DATE OF DISCHARGE:                              HISTORY & PHYSICAL   PRIMARY CARE PHYSICIAN:  Dr. Aggie Myers.   CHIEF COMPLAINT:  Shortness of breath.   HISTORY OF PRESENT ILLNESS:  [name] is a 77 year old female with a  past medical history of mild asthma who.  She is accompanied by her son  [name].  [name] gives the history.  He states that the patient developed  shortness of breath yesterday.  [name],  Dr. Aggie Myers, was called.  The patient was started on a Z-Pak.  The  patient began to take the antibiotics but displayed no improvement in  her symptoms.  Her p.o. intake has declined over the past couple of  days.  She has progressively become more weak.  She has a cough  productive of a green phlegm.  She has become more confused and she has  bouts whereby her body feels hot followed by feeling cold.  There has  been no complaint of nausea or emesis.  No diarrhea, no sick contacts at  home.  She had some slight chest discomfort this morning when she first  woke up but that was very short lasting.  She denies having any current  chest discomfort.   PAST MEDICAL HISTORY:  1. Bronchopneumonia.  2. Elevated calcium level.  3. Arthritis.  4. Back pain, chronic.  5. Asthma.  6. Mild dementia.   PAST SURGICAL HISTORY:  1. Total abdominal hysterectomy secondary to tumors.  2. Mastoid bone surgery x2.   ALLERGIES:  MERTHIOLATE.   HOME MEDICATIONS:  1. Vicodin 5/500.  2. Valium 5 mg.  3. Prevacid 30 mg p.o. daily.  4. Metoprolol 50 mg (the patient takes a half a tablet b.i.d.).  5. Tenuate Dospan 75 mg p.o. daily.  6. Quinine sulfate 200 mg p.o. daily.  7. Lasix 40  mg daily.  8. Magnesium oxide 400 mg p.o. daily.  9. Diflucan 150 mg p.o. daily for 1 week.  10.Clarinex 5 mg p.o. daily.  11.Synthroid 100 mg p.o. daily.  12.Diphenhydramine 50 mg p.o. daily p.r.n.  13.Fluoxetine 20 mg p.o. daily p.r.n.   SOCIAL HISTORY:  Cigarettes:  The patient stopped smoking 6 years ago.  Started smoking at the age of 18.  Alcohol:  The patient denies alcohol  since 1968.  She had a remote history of alcohol abuse and attended AA.   FAMILY HISTORY:  Mother had Alzheimer's.  Father MI.  Two brothers had  MI.  One sister died from brain tumor.   REVIEW OF SYSTEMS:  As per HPI.   PHYSICAL EXAMINATION:  VITALS:  Blood pressure 139/54, temperature  101.8, pulse 83, respirations 20, O2 saturation 93%.  HEENT:  Normocephalic and atraumatic.  Pupils are equally reactive to  light.  Oral mucosa is  pink, anicteric.  Dentures present in the upper  region of the mouth.  NECK:  No JVD, no lymphadenopathy.  Thyroid not palpable.  CARDIAC:  Heart sounds are distant.  RESPIRATORY:  Loud wheezes on expiration bilaterally.  ABDOMEN:  Soft, nontender, and nondistended.  Positive bowel sounds x4  quadrants.  EXTREMITIES:  No leg edema.  NEUROLOGICAL:  The patient is alert and oriented x3.  MUSCULOSKELETAL:  Upper and lower extremities 5/5 strength.   White blood cell count 7.9, hemoglobin 11.2, hematocrit 32.9, platelets  198.  Sodium 135, potassium 4.1, chloride 106, CO2 25, BUN 25,  creatinine 0.99, glucose 100.  Bilirubin total 0.5, alkaline phosphate  116, SGOT 23, SGPT 16, total protein 6.2, albumin 3.4, calcium 9.7.  Chest x-ray reveals mild peribronchial thickening without focal airspace  opacity, mild right basilar atelectasis.   ASSESSMENT/PLAN:  1. Respiratory tract infection:  Bronchitis may be a component,  as      depicted on chest x-ray.  We will start the patient on empiric IV      antibiotics consistent of azithromycin and Rocephin.  We will check       sputum cultures as well as blood cultures for now.  We will also      provide an incentive spirometer and start the patient on nebulized      breathing treatments.  2. Hypoalbuminemia:  Whether or not protein calorie malnutrition is      contributing to this is questionable versus the fact that the      patient currently had an infection.  We will check a prealbumin and      we will monitor.  3. Weakness:  This more than likely is related to the patient's acute      illness.  We will treat the acute illness and if needed consult      physical therapy and occupational therapy.  4. Chronic back pain:  We will resume the patient's prior home      medications.  5. Gastrointestinal prophylaxis:  We will provide Protonix.  6. Deep vein thrombosis prophylaxis:  We will provide Lovenox.     Michaelyn Barter, M.D.  Electronically Signed    OR/MEDQ  D:  05/14/2006  T:  05/14/2006  Job:  [id number]   cc:   Jaclyn Prime. Lucas Mallow, M.D.

## 2010-07-27 NOTE — Discharge Summary (Signed)
[name], [name]               ACCOUNT NO.:  [id number]   MEDICAL RECORD NO.:  [id number]          PATIENT TYPE:  INP   LOCATION:  5708                         FACILITY:  [facility]   PHYSICIAN:  Marcellus Scott, MD     DATE OF BIRTH:  Jun 08, 1926   DATE OF ADMISSION:  05/13/2006  DATE OF DISCHARGE:  05/17/2006                               DISCHARGE SUMMARY   DISCHARGE DIAGNOSES:  1. Acute asthmatic bronchitis.  2. Acute maxillary sinusitis.  3. Coagulase-negative Staphylococcus on a single blood culture.  4. Major depression.  5. Hypothyroidism.  6. Anemia.   DISCHARGE MEDICATIONS:  1. Avelox 400 mg p.o. daily for a total course of 10 days.  2. Prednisone taper.  3. Albuterol 90 mcg per spray MDI 2 puffs q.4-6h. p.r.n.  4. Atrovent inhalers 17 mcg per spray MDI q.i.d. p.r.n.  5. Celexa 10 mg p.o. daily in the mornings.  6. Prevacid 30 mg p.o. daily.  7. Metoprolol XL 50 mg p.o. daily.  8. Lasix 40 mg p.o. daily.  9. Magnesium oxide 400 mg p.o. daily.  10.Synthroid 100 mcg p.o. daily.  11.Mucinex 600 mg p.o. b.i.d.  12.Robitussin DM 5 mL p.o. q.4-6h. p.r.n.  13.Quinine 200 mg p.o. daily.  14.Vicodin 5/500  1 tablet p.o. q.6h. p.r.n.   PROCEDURES:  1. On May 15, 2006, CT of the head without contrast.      a.     No acute intracranial finding.      b.     Ethmoid and maxillary acute sinusitis with air/fluid levels.  2. On May 13, 2006, chest x-ray.      a.     Mild peribronchial thickening without focal air space       disease.      b.     Mild right basilar atelectasis.   CONSULTATIONS:  1. Psychiatry, Dr. Jeanie Sewer.  2. Physical Therapy and Occupational Therapy.   HOSPITAL COURSE AND PATIENT DISPOSITION:  For details of the initial  part of the admission, please refer to the history and physical note  that was done by Dr. Michaelyn Barter on May 13, 2006.   In summary, [name] is a 77 year old female patient with history of  asthma, hypertension,  hypothyroidism, depression, who presented to the  Emergency Room with:   1. A history of dyspnea which was evaluated as an outpatient by the      primary care physician and started on a Z-Pak.  Patient, however,      continued to have no improvement, with worsening weakness and      productive cough, worsening confusion.  She was evaluated in the      Emergency Room and assessed to have acute bronchitis and admitted      for further management.  Patient was admitted to the medical floor.      Her blood cultures and sputum cultures were sent off, the results      of which are as above.  She was placed initially on intravenous      ceftriaxone and Zithromax.  She also received Mucinex, Robitussin,  bronchodilator nebulizations.  On day #2 of admission, however,      patient was noted to have worsening of her rhonchi suggestive of      bronchospasm for which steroids were started, initially IV and then      switched to oral steroids which will be rapidly tapered.  Patient      has progressively done well with no dyspnea at this time or      tightness of the chest.  Her coughing is still there but      significantly improved, and the sputum is clearing.  Patient will      complete the course of Avelox orally, and prednisone taper as an      outpatient.  Patient has been instructed to seek immediate      attention in the case of worsening of symptoms.  Her flu antigens      were checked and were negative.  2. Acute ethmoid and maxillary sinusitis.  Patient, for her mental      status, had a CT scan of the head with incidental finding of acute      sinusitis.  She will complete a 10-day course of Avelox for this.  3. Coagulase-negative Staphylococcus on a single blood culture.      Patient has, however, in the hospital been nontoxic with no fever      or leukocytosis.  She was on vancomycin IV for a day which was      promptly discontinued, and continues to  remain stable.  4. Major  depression.  The patient lives with her son.  She has lost      her spouse two years ago.  She is said to have intermittent      confusion even at home but of late, over the last four to six      weeks, apparently has become paranoid, suggesting that her son is      having a relationship with his aunt, also with hallucinations and      delusions.  Patient's daughter also thinks that she might be      hypochondriac  and with depression.  For these, patient had a CT of      the head done which was negative.  A Psychiatry consult was      requested and Dr. Jeanie Sewer kindly saw the patient, discontinued      her fluoxetine and placed the patient on Celexa.  B12, folate, and      RPR have been requested and are pending.  These are to be followed      up as an outpatient.  However, patient is stable and this is to be      followed up as an outpatient.  5. Hypothyroidism.  Patient was continued on Synthroid and her TSH      levels are normal.  6. Anemia which is also stable.  This is microcytic anemia and workup      to be done as an outpatient as deemed necessary.  7. History of back pain and leg cramps for which patient takes quinine      chronically as an outpatient.  8. Patient's home meds included Valium.  However, she takes this very      infrequently.  It was not provided for her in the hospital.  She      did not demonstrate any features of withdrawal and it is suggested      that it not be prescribed as  an outpatient for her.  9. Patient had physical therapy evaluation in the hospital today and      they have assessed her as safe to go home, and use her cane and      rolling walker at home.   Lab: Hb 11.1, hct 32.8, MCV 80.6; BNP 191; TSH 1.016; BUN 15, Creat 0.77      Marcellus Scott, MD  Electronically Signed     AH/MEDQ  D:  05/17/2006  T:  05/17/2006  Job:  [id number]   cc:   Jaclyn Prime. Lucas Mallow, M.D.

## 2010-09-06 ENCOUNTER — Emergency Department (HOSPITAL_COMMUNITY)
Admission: EM | Admit: 2010-09-06 | Discharge: 2010-09-06 | Disposition: A | Discharge status: Home / Self Care | Payer: Medicare Other | Attending: Emergency Medicine | Admitting: Emergency Medicine

## 2010-09-06 ENCOUNTER — Emergency Department (HOSPITAL_COMMUNITY): Payer: Medicare Other

## 2010-09-06 DIAGNOSIS — M25559 Pain in unspecified hip: Secondary | ICD-10-CM | POA: Insufficient documentation

## 2010-09-06 DIAGNOSIS — Z79899 Other long term (current) drug therapy: Secondary | ICD-10-CM | POA: Insufficient documentation

## 2010-09-06 DIAGNOSIS — W010XXA Fall on same level from slipping, tripping and stumbling without subsequent striking against object, initial encounter: Secondary | ICD-10-CM | POA: Insufficient documentation

## 2010-09-06 DIAGNOSIS — Y92009 Unspecified place in unspecified non-institutional (private) residence as the place of occurrence of the external cause: Secondary | ICD-10-CM | POA: Insufficient documentation

## 2010-09-06 DIAGNOSIS — Z7982 Long term (current) use of aspirin: Secondary | ICD-10-CM | POA: Insufficient documentation

## 2010-09-06 DIAGNOSIS — S0083XA Contusion of other part of head, initial encounter: Secondary | ICD-10-CM | POA: Insufficient documentation

## 2010-09-06 DIAGNOSIS — S0003XA Contusion of scalp, initial encounter: Secondary | ICD-10-CM | POA: Insufficient documentation

## 2010-09-06 DIAGNOSIS — M79609 Pain in unspecified limb: Secondary | ICD-10-CM | POA: Insufficient documentation

## 2010-09-06 DIAGNOSIS — IMO0002 Reserved for concepts with insufficient information to code with codable children: Secondary | ICD-10-CM | POA: Insufficient documentation

## 2010-09-06 DIAGNOSIS — I10 Essential (primary) hypertension: Secondary | ICD-10-CM | POA: Insufficient documentation

## 2010-09-06 DIAGNOSIS — S0180XA Unspecified open wound of other part of head, initial encounter: Secondary | ICD-10-CM | POA: Insufficient documentation

## 2010-09-06 DIAGNOSIS — S32509A Unspecified fracture of unspecified pubis, initial encounter for closed fracture: Secondary | ICD-10-CM | POA: Insufficient documentation

## 2010-09-06 DIAGNOSIS — F0390 Unspecified dementia without behavioral disturbance: Secondary | ICD-10-CM | POA: Insufficient documentation

## 2010-09-06 DIAGNOSIS — M542 Cervicalgia: Secondary | ICD-10-CM | POA: Insufficient documentation

## 2010-09-06 DIAGNOSIS — S61409A Unspecified open wound of unspecified hand, initial encounter: Secondary | ICD-10-CM | POA: Insufficient documentation

## 2010-09-06 DIAGNOSIS — E039 Hypothyroidism, unspecified: Secondary | ICD-10-CM | POA: Insufficient documentation

## 2010-09-06 DIAGNOSIS — Z9889 Other specified postprocedural states: Secondary | ICD-10-CM | POA: Insufficient documentation

## 2010-09-06 DIAGNOSIS — S40019A Contusion of unspecified shoulder, initial encounter: Secondary | ICD-10-CM | POA: Insufficient documentation

## 2010-09-10 ENCOUNTER — Emergency Department (HOSPITAL_COMMUNITY)
Admission: EM | Admit: 2010-09-10 | Discharge: 2010-09-10 | Disposition: A | Discharge status: Home / Self Care | Payer: Medicare Other | Attending: Emergency Medicine | Admitting: Emergency Medicine

## 2010-09-10 ENCOUNTER — Emergency Department (HOSPITAL_COMMUNITY): Payer: Medicare Other

## 2010-09-10 DIAGNOSIS — I10 Essential (primary) hypertension: Secondary | ICD-10-CM | POA: Insufficient documentation

## 2010-09-10 DIAGNOSIS — S1093XA Contusion of unspecified part of neck, initial encounter: Secondary | ICD-10-CM | POA: Insufficient documentation

## 2010-09-10 DIAGNOSIS — R079 Chest pain, unspecified: Secondary | ICD-10-CM | POA: Insufficient documentation

## 2010-09-10 DIAGNOSIS — W19XXXA Unspecified fall, initial encounter: Secondary | ICD-10-CM | POA: Insufficient documentation

## 2010-09-10 DIAGNOSIS — E039 Hypothyroidism, unspecified: Secondary | ICD-10-CM | POA: Insufficient documentation

## 2010-09-10 DIAGNOSIS — Y92009 Unspecified place in unspecified non-institutional (private) residence as the place of occurrence of the external cause: Secondary | ICD-10-CM | POA: Insufficient documentation

## 2010-09-10 DIAGNOSIS — I252 Old myocardial infarction: Secondary | ICD-10-CM | POA: Insufficient documentation

## 2010-09-10 DIAGNOSIS — R059 Cough, unspecified: Secondary | ICD-10-CM | POA: Insufficient documentation

## 2010-09-10 DIAGNOSIS — R05 Cough: Secondary | ICD-10-CM | POA: Insufficient documentation

## 2010-09-10 DIAGNOSIS — F0390 Unspecified dementia without behavioral disturbance: Secondary | ICD-10-CM | POA: Insufficient documentation

## 2010-09-10 DIAGNOSIS — T1490XA Injury, unspecified, initial encounter: Secondary | ICD-10-CM | POA: Insufficient documentation

## 2010-09-10 DIAGNOSIS — S0003XA Contusion of scalp, initial encounter: Secondary | ICD-10-CM | POA: Insufficient documentation

## 2010-09-10 LAB — BASIC METABOLIC PANEL
CO2: 27 mEq/L (ref 19–32)
Calcium: 11.2 mg/dL — ABNORMAL HIGH (ref 8.4–10.5)
Chloride: 106 mEq/L (ref 96–112)
Creatinine, Ser: 0.84 mg/dL (ref 0.50–1.10)
GFR calc Af Amer: >60 mL/min (ref >60)
Sodium: 143 mEq/L (ref 135–145)

## 2010-09-10 LAB — DIFFERENTIAL
Basophils Relative: 1 % (ref 0–1)
Eosinophils Absolute: 0.3 10*3/uL (ref 0.0–0.7)
Eosinophils Relative: 4 % (ref 0–5)
Lymphs Abs: 1.3 10*3/uL (ref 0.7–4.0)
Neutrophils Relative %: 71 % (ref 43–77)

## 2010-09-10 LAB — CBC
MCH: 25.8 pg — ABNORMAL LOW (ref 26.0–34.0)
MCV: 84 fL (ref 78.0–100.0)
Platelets: 176 10*3/uL (ref 150–400)
RBC: 4.69 MIL/uL (ref 3.87–5.11)
RDW: 15.9 % — ABNORMAL HIGH (ref 11.5–15.5)
WBC: 7.1 10*3/uL (ref 4.0–10.5)

## 2010-09-11 LAB — GLUCOSE, CAPILLARY
Comment 1: 0
Glucose-Capillary: 68 mg/dL — ABNORMAL LOW (ref 70–99)

## 2011-03-14 ENCOUNTER — Ambulatory Visit
Admission: RE | Admit: 2011-03-14 | Discharge: 2011-03-14 | Disposition: A | Discharge status: Home / Self Care | Payer: Medicare Other | Source: Ambulatory Visit | Attending: Internal Medicine | Admitting: Internal Medicine

## 2011-03-14 ENCOUNTER — Other Ambulatory Visit: Payer: Self-pay | Admitting: Internal Medicine

## 2011-03-14 DIAGNOSIS — G44309 Post-traumatic headache, unspecified, not intractable: Secondary | ICD-10-CM

## 2011-06-28 ENCOUNTER — Other Ambulatory Visit: Payer: Self-pay | Admitting: Internal Medicine

## 2011-06-28 ENCOUNTER — Ambulatory Visit
Admission: RE | Admit: 2011-06-28 | Discharge: 2011-06-28 | Disposition: A | Discharge status: Home / Self Care | Payer: Medicare Other | Source: Ambulatory Visit | Attending: Internal Medicine | Admitting: Internal Medicine

## 2011-06-28 DIAGNOSIS — R51 Headache: Secondary | ICD-10-CM

## 2011-09-26 ENCOUNTER — Ambulatory Visit
Admission: RE | Admit: 2011-09-26 | Discharge: 2011-09-26 | Disposition: A | Discharge status: Home / Self Care | Payer: Medicare Other | Source: Ambulatory Visit | Attending: Internal Medicine | Admitting: Internal Medicine

## 2011-09-26 ENCOUNTER — Other Ambulatory Visit: Payer: Self-pay | Admitting: Internal Medicine

## 2011-09-26 DIAGNOSIS — R51 Headache: Secondary | ICD-10-CM

## 2012-01-15 ENCOUNTER — Encounter (HOSPITAL_COMMUNITY): Payer: Self-pay | Admitting: Emergency Medicine

## 2012-01-15 ENCOUNTER — Emergency Department (HOSPITAL_COMMUNITY): Payer: Medicare Other

## 2012-01-15 ENCOUNTER — Inpatient Hospital Stay (HOSPITAL_COMMUNITY)
Admission: EM | Admit: 2012-01-15 | Discharge: 2012-01-17 | DRG: 206 | Disposition: A | Discharge status: Skilled Nursing Facility | Payer: Medicare Other | Attending: Internal Medicine | Admitting: Internal Medicine

## 2012-01-15 DIAGNOSIS — Z66 Do not resuscitate: Secondary | ICD-10-CM | POA: Diagnosis present

## 2012-01-15 DIAGNOSIS — F329 Major depressive disorder, single episode, unspecified: Secondary | ICD-10-CM | POA: Diagnosis present

## 2012-01-15 DIAGNOSIS — J45909 Unspecified asthma, uncomplicated: Secondary | ICD-10-CM | POA: Diagnosis present

## 2012-01-15 DIAGNOSIS — Z79899 Other long term (current) drug therapy: Secondary | ICD-10-CM

## 2012-01-15 DIAGNOSIS — M129 Arthropathy, unspecified: Secondary | ICD-10-CM | POA: Diagnosis present

## 2012-01-15 DIAGNOSIS — S2239XA Fracture of one rib, unspecified side, initial encounter for closed fracture: Secondary | ICD-10-CM

## 2012-01-15 DIAGNOSIS — I4891 Unspecified atrial fibrillation: Secondary | ICD-10-CM | POA: Diagnosis present

## 2012-01-15 DIAGNOSIS — S2249XA Multiple fractures of ribs, unspecified side, initial encounter for closed fracture: Principal | ICD-10-CM | POA: Diagnosis present

## 2012-01-15 DIAGNOSIS — Z85828 Personal history of other malignant neoplasm of skin: Secondary | ICD-10-CM

## 2012-01-15 DIAGNOSIS — E785 Hyperlipidemia, unspecified: Secondary | ICD-10-CM | POA: Diagnosis present

## 2012-01-15 DIAGNOSIS — Z9181 History of falling: Secondary | ICD-10-CM

## 2012-01-15 DIAGNOSIS — Z9981 Dependence on supplemental oxygen: Secondary | ICD-10-CM

## 2012-01-15 DIAGNOSIS — M109 Gout, unspecified: Secondary | ICD-10-CM | POA: Diagnosis present

## 2012-01-15 DIAGNOSIS — F3289 Other specified depressive episodes: Secondary | ICD-10-CM | POA: Diagnosis present

## 2012-01-15 DIAGNOSIS — I252 Old myocardial infarction: Secondary | ICD-10-CM

## 2012-01-15 DIAGNOSIS — I10 Essential (primary) hypertension: Secondary | ICD-10-CM | POA: Diagnosis present

## 2012-01-15 DIAGNOSIS — R296 Repeated falls: Secondary | ICD-10-CM

## 2012-01-15 DIAGNOSIS — Z7982 Long term (current) use of aspirin: Secondary | ICD-10-CM

## 2012-01-15 DIAGNOSIS — Z8673 Personal history of transient ischemic attack (TIA), and cerebral infarction without residual deficits: Secondary | ICD-10-CM

## 2012-01-15 DIAGNOSIS — E039 Hypothyroidism, unspecified: Secondary | ICD-10-CM | POA: Diagnosis present

## 2012-01-15 DIAGNOSIS — F0390 Unspecified dementia without behavioral disturbance: Secondary | ICD-10-CM

## 2012-01-15 DIAGNOSIS — E7800 Pure hypercholesterolemia, unspecified: Secondary | ICD-10-CM | POA: Diagnosis present

## 2012-01-15 DIAGNOSIS — R269 Unspecified abnormalities of gait and mobility: Secondary | ICD-10-CM | POA: Diagnosis present

## 2012-01-15 DIAGNOSIS — W19XXXA Unspecified fall, initial encounter: Secondary | ICD-10-CM | POA: Diagnosis present

## 2012-01-15 DIAGNOSIS — N179 Acute kidney failure, unspecified: Secondary | ICD-10-CM

## 2012-01-15 HISTORY — DX: Repeated falls: R29.6

## 2012-01-15 HISTORY — DX: Shortness of breath: R06.02

## 2012-01-15 HISTORY — DX: Pneumonia, unspecified organism: J18.9

## 2012-01-15 HISTORY — DX: Unspecified osteoarthritis, unspecified site: M19.90

## 2012-01-15 HISTORY — DX: Cerebral infarction, unspecified: I63.9

## 2012-01-15 HISTORY — DX: Pure hypercholesterolemia, unspecified: E78.00

## 2012-01-15 HISTORY — DX: Acute myocardial infarction, unspecified: I21.9

## 2012-01-15 HISTORY — DX: Dependence on supplemental oxygen: Z99.81

## 2012-01-15 HISTORY — DX: Major depressive disorder, single episode, unspecified: F32.9

## 2012-01-15 HISTORY — DX: Unspecified dementia, unspecified severity, without behavioral disturbance, psychotic disturbance, mood disturbance, and anxiety: F03.90

## 2012-01-15 HISTORY — DX: Essential (primary) hypertension: I10

## 2012-01-15 HISTORY — DX: Unspecified asthma, uncomplicated: J45.909

## 2012-01-15 HISTORY — DX: Unspecified malignant neoplasm of skin, unspecified: C44.90

## 2012-01-15 HISTORY — DX: Angina pectoris, unspecified: I20.9

## 2012-01-15 HISTORY — DX: Hypothyroidism, unspecified: E03.9

## 2012-01-15 HISTORY — DX: Depression, unspecified: F32.A

## 2012-01-15 HISTORY — DX: Dizziness and giddiness: R42

## 2012-01-15 LAB — URINALYSIS, ROUTINE W REFLEX MICROSCOPIC
Bilirubin Urine: NEGATIVE
Glucose, UA: NEGATIVE mg/dL
Hgb urine dipstick: NEGATIVE
Ketones, ur: NEGATIVE mg/dL
Nitrite: NEGATIVE
Protein, ur: NEGATIVE mg/dL
Specific Gravity, Urine: 1.016 (ref 1.005–1.030)
Urobilinogen, UA: 0.2 mg/dL (ref 0.0–1.0)
pH: 5 (ref 5.0–8.0)

## 2012-01-15 LAB — CBC WITH DIFFERENTIAL/PLATELET
Basophils Absolute: 0 10*3/uL (ref 0.0–0.1)
Basophils Relative: 1 % (ref 0–1)
Eosinophils Absolute: 0.3 10*3/uL (ref 0.0–0.7)
Eosinophils Relative: 6 % — ABNORMAL HIGH (ref 0–5)
HCT: 33.3 % — ABNORMAL LOW (ref 36.0–46.0)
Hemoglobin: 10.7 g/dL — ABNORMAL LOW (ref 12.0–15.0)
Lymphocytes Relative: 22 % (ref 12–46)
Lymphs Abs: 1.2 10*3/uL (ref 0.7–4.0)
MCH: 29.2 pg (ref 26.0–34.0)
MCHC: 32.1 g/dL (ref 30.0–36.0)
MCV: 90.7 fL (ref 78.0–100.0)
Monocytes Absolute: 0.5 10*3/uL (ref 0.1–1.0)
Monocytes Relative: 8 % (ref 3–12)
Neutro Abs: 3.6 10*3/uL (ref 1.7–7.7)
Neutrophils Relative %: 63 % (ref 43–77)
Platelets: 202 10*3/uL (ref 150–400)
RBC: 3.67 MIL/uL — ABNORMAL LOW (ref 3.87–5.11)
RDW: 14.5 % (ref 11.5–15.5)
WBC: 5.7 10*3/uL (ref 4.0–10.5)

## 2012-01-15 LAB — BASIC METABOLIC PANEL
BUN: 46 mg/dL — ABNORMAL HIGH (ref 6–23)
CO2: 25 mEq/L (ref 19–32)
Calcium: 10.9 mg/dL — ABNORMAL HIGH (ref 8.4–10.5)
Chloride: 100 mEq/L (ref 96–112)
Creatinine, Ser: 1.34 mg/dL — ABNORMAL HIGH (ref 0.50–1.10)
GFR calc Af Amer: 41 mL/min — ABNORMAL LOW (ref >90)
GFR calc non Af Amer: 36 mL/min — ABNORMAL LOW (ref >90)
Glucose, Bld: 105 mg/dL — ABNORMAL HIGH (ref 70–99)
Potassium: 4.9 mEq/L (ref 3.5–5.1)
Sodium: 137 mEq/L (ref 135–145)

## 2012-01-15 LAB — CBC
HCT: 36.2 % (ref 36.0–46.0)
Hemoglobin: 11.7 g/dL — ABNORMAL LOW (ref 12.0–15.0)
MCH: 29.4 pg (ref 26.0–34.0)
MCV: 91 fL (ref 78.0–100.0)
RBC: 3.98 MIL/uL (ref 3.87–5.11)

## 2012-01-15 LAB — URINE MICROSCOPIC-ADD ON

## 2012-01-15 MED ORDER — HYDROCODONE-ACETAMINOPHEN 7.5-325 MG PO TABS
1.0000 | ORAL_TABLET | ORAL | Status: DC | PRN
  Administered 2012-01-16 (×2): 1 via ORAL
  Filled 2012-01-15 (×2): qty 1

## 2012-01-15 MED ORDER — SODIUM CHLORIDE 0.9 % IV SOLN
INTRAVENOUS | Status: DC
  Administered 2012-01-15: 21:00:00 via INTRAVENOUS
  Administered 2012-01-16: 10 mL via INTRAVENOUS
  Administered 2012-01-16: 75 mL/h via INTRAVENOUS
  Administered 2012-01-16: 12:00:00 via INTRAVENOUS

## 2012-01-15 MED ORDER — SIMVASTATIN 10 MG PO TABS
10.0000 mg | ORAL_TABLET | Freq: Every day | ORAL | Status: DC
  Administered 2012-01-15 – 2012-01-16 (×2): 10 mg via ORAL
  Filled 2012-01-15 (×3): qty 1

## 2012-01-15 MED ORDER — ALLOPURINOL 100 MG PO TABS
100.0000 mg | ORAL_TABLET | Freq: Every day | ORAL | Status: DC
  Administered 2012-01-15 – 2012-01-17 (×3): 100 mg via ORAL
  Filled 2012-01-15 (×4): qty 1

## 2012-01-15 MED ORDER — LEVOTHYROXINE SODIUM 50 MCG PO TABS
50.0000 ug | ORAL_TABLET | Freq: Every day | ORAL | Status: DC
  Administered 2012-01-16 – 2012-01-17 (×2): 50 ug via ORAL
  Filled 2012-01-15 (×4): qty 1

## 2012-01-15 MED ORDER — MORPHINE SULFATE 2 MG/ML IJ SOLN
2.0000 mg | Freq: Once | INTRAMUSCULAR | Status: AC
  Administered 2012-01-15: 2 mg via INTRAVENOUS
  Filled 2012-01-15: qty 1

## 2012-01-15 MED ORDER — GABAPENTIN 300 MG PO CAPS
600.0000 mg | ORAL_CAPSULE | Freq: Every day | ORAL | Status: DC
  Administered 2012-01-15 – 2012-01-16 (×2): 600 mg via ORAL
  Filled 2012-01-15 (×3): qty 2

## 2012-01-15 MED ORDER — DONEPEZIL HCL 5 MG PO TABS
5.0000 mg | ORAL_TABLET | Freq: Every day | ORAL | Status: DC
  Administered 2012-01-15 – 2012-01-16 (×2): 5 mg via ORAL
  Filled 2012-01-15 (×3): qty 1

## 2012-01-15 MED ORDER — RISPERIDONE 1 MG PO TABS
1.0000 mg | ORAL_TABLET | Freq: Every evening | ORAL | Status: DC | PRN
  Administered 2012-01-15 – 2012-01-16 (×3): 1 mg via ORAL
  Filled 2012-01-15 (×6): qty 1

## 2012-01-15 MED ORDER — ACETAMINOPHEN 325 MG PO TABS: 650.0000 mg | ORAL_TABLET | Freq: Four times a day (QID) | ORAL | Status: DC | PRN

## 2012-01-15 MED ORDER — GABAPENTIN 300 MG PO CAPS
300.0000 mg | ORAL_CAPSULE | Freq: Every day | ORAL | Status: DC
  Administered 2012-01-16 – 2012-01-17 (×2): 300 mg via ORAL
  Filled 2012-01-15 (×2): qty 1

## 2012-01-15 MED ORDER — GABAPENTIN 300 MG PO CAPS
300.0000 mg | ORAL_CAPSULE | Freq: Two times a day (BID) | ORAL | Status: DC
  Filled 2012-01-15: qty 2

## 2012-01-15 MED ORDER — ENOXAPARIN SODIUM 30 MG/0.3ML ~~LOC~~ SOLN
30.0000 mg | SUBCUTANEOUS | Status: DC
  Administered 2012-01-15 – 2012-01-16 (×2): 30 mg via SUBCUTANEOUS
  Filled 2012-01-15 (×3): qty 0.3

## 2012-01-15 MED ORDER — METOPROLOL SUCCINATE ER 25 MG PO TB24
25.0000 mg | ORAL_TABLET | Freq: Every day | ORAL | Status: DC
  Administered 2012-01-15 – 2012-01-17 (×3): 25 mg via ORAL
  Filled 2012-01-15 (×3): qty 1

## 2012-01-15 MED ORDER — FLUOXETINE HCL 20 MG PO CAPS
20.0000 mg | ORAL_CAPSULE | Freq: Every day | ORAL | Status: DC
  Administered 2012-01-15 – 2012-01-17 (×3): 20 mg via ORAL
  Filled 2012-01-15 (×3): qty 1

## 2012-01-15 MED ORDER — ALBUTEROL SULFATE (5 MG/ML) 0.5% IN NEBU: 2.5000 mg | INHALATION_SOLUTION | RESPIRATORY_TRACT | Status: DC | PRN

## 2012-01-15 MED ORDER — ACETAMINOPHEN 650 MG RE SUPP: 650.0000 mg | Freq: Four times a day (QID) | RECTAL | Status: DC | PRN

## 2012-01-15 MED ORDER — ASPIRIN 325 MG PO TABS
325.0000 mg | ORAL_TABLET | Freq: Every day | ORAL | Status: DC
Start: 2012-01-15 — End: 2012-01-17
  Administered 2012-01-15 – 2012-01-17 (×3): 325 mg via ORAL
  Filled 2012-01-15 (×3): qty 1

## 2012-01-15 MED ORDER — DIAZEPAM 5 MG PO TABS: 5.0000 mg | ORAL_TABLET | Freq: Two times a day (BID) | ORAL | Status: DC | PRN

## 2012-01-15 MED ORDER — TIOTROPIUM BROMIDE MONOHYDRATE 18 MCG IN CAPS
18.0000 ug | ORAL_CAPSULE | Freq: Every day | RESPIRATORY_TRACT | Status: DC
  Administered 2012-01-16 – 2012-01-17 (×2): 18 ug via RESPIRATORY_TRACT
  Filled 2012-01-15: qty 5

## 2012-01-15 MED ORDER — TIOTROPIUM BROMIDE MONOHYDRATE 18 MCG IN CAPS
18.0000 ug | ORAL_CAPSULE | Freq: Every day | RESPIRATORY_TRACT | Status: DC
  Filled 2012-01-15: qty 5

## 2012-01-15 MED ORDER — ONDANSETRON HCL 4 MG PO TABS: 4.0000 mg | ORAL_TABLET | Freq: Four times a day (QID) | ORAL | Status: DC | PRN

## 2012-01-15 MED ORDER — ONDANSETRON HCL 4 MG/2ML IJ SOLN: 4.0000 mg | Freq: Four times a day (QID) | INTRAMUSCULAR | Status: DC | PRN

## 2012-01-15 NOTE — ED Notes (Signed)
Report called to [facility]

## 2012-01-15 NOTE — ED Notes (Signed)
Family at bedside. 

## 2012-01-15 NOTE — ED Notes (Signed)
To ED from home via EMS, family called for multiple recent falls, pt arrives with multiple bruises to face, bilat arms, LLQ, back and breasts, c/o right sided rib pain which is worse with palpation, VSS, NAD

## 2012-01-15 NOTE — ED Provider Notes (Signed)
History     CSN: [id number]  Arrival date & time 01/15/12  1027   First MD Initiated Contact with Patient 01/15/12 1040      Chief Complaint  Patient presents with  . Fall    (Consider location/radiation/quality/duration/timing/severity/associated sxs/prior treatment) HPI Patient presents to the emergency department with multiple falls over the last several weeks.  Family states that she is unable to walk without falling over the last 2 or 3 days.  Patient uses a walker, but is unable to steady herself. The patient is not able to give me much information about her falls. But she has pain in her R ribs. She has bruising in many areas of her body.  Patient denies dizziness, syncope, vomiting, headache, shortness of breath, chest pain , or syncope.  Past Medical History  Diagnosis Date  . Stroke   . Arthritis   . Dementia     No past surgical history on file.  No family history on file.  History  Substance Use Topics  . Smoking status: Not on file  . Smokeless tobacco: Not on file  . Alcohol Use:     OB History    Grav Para Term Preterm Abortions TAB SAB Ect Mult Living                  Review of Systems All other systems negative except as documented in the HPI. All pertinent positives and negatives as reviewed in the HPI.  Allergies  Other  Home Medications   Current Outpatient Rx  Name  Route  Sig  Dispense  Refill  . ALBUTEROL SULFATE (2.5 MG/3ML) 0.083% IN NEBU   Nebulization   Take 2.5 mg by nebulization every 4 (four) hours as needed. For shortness of breath         . ALLOPURINOL 100 MG PO TABS   Oral   Take 100 mg by mouth daily.         . ASPIRIN 325 MG PO TABS   Oral   Take 325 mg by mouth daily.         Marland Kitchen VITAMIN D PO   Oral   Take 1 tablet by mouth daily.         Marland Kitchen DIAZEPAM 5 MG PO TABS   Oral   Take 5 mg by mouth every 12 (twelve) hours as needed. For anxiety         . DONEPEZIL HCL 5 MG PO TABS   Oral   Take 5 mg by mouth  at bedtime.         Marland Kitchen FLUCONAZOLE 150 MG PO TABS   Oral   Take 150 mg by mouth 2 (two) times a week.         Marland Kitchen FLUOXETINE HCL 20 MG PO CAPS   Oral   Take 20 mg by mouth daily.         Marland Kitchen FLUTICASONE PROPIONATE 50 MCG/ACT NA SUSP   Nasal   Place 2 sprays into the nose daily.         . FUROSEMIDE 40 MG PO TABS   Oral   Take 40 mg by mouth daily.         Marland Kitchen GABAPENTIN 300 MG PO CAPS   Oral   Take 300-600 mg by mouth 2 (two) times daily. 1 cap in AM and 2 in PM         . HYDROCODONE-ACETAMINOPHEN 7.5-325 MG PO TABS   Oral   Take 1  tablet by mouth every 4 (four) hours as needed. For pain         . LEVOTHYROXINE SODIUM 50 MCG PO TABS   Oral   Take 50 mcg by mouth daily.         Marland Kitchen METOPROLOL SUCCINATE ER 25 MG PO TB24   Oral   Take 25 mg by mouth daily.         Marland Kitchen RISPERIDONE 1 MG PO TABS   Oral   Take 1 mg by mouth at bedtime and may repeat dose one time if needed.         Marland Kitchen SIMVASTATIN 10 MG PO TABS   Oral   Take 10 mg by mouth at bedtime.         Marland Kitchen TIOTROPIUM BROMIDE MONOHYDRATE 18 MCG IN CAPS   Inhalation   Place 18 mcg into inhaler and inhale daily.           BP 151/38  Pulse 60  Temp 98.1 F (36.7 C) (Oral)  Resp 18  SpO2 99%  Physical Exam  Nursing note and vitals reviewed. Constitutional: She appears well-developed and well-nourished. No distress.  HENT:  Head: Head is with abrasion and with contusion. Head is without laceration.  Mouth/Throat: Oropharynx is clear and moist.  Eyes: EOM are normal. Pupils are equal, round, and reactive to light.  Neck: Normal range of motion. Neck supple.  Cardiovascular: Normal rate and regular rhythm.   Pulmonary/Chest: Effort normal and breath sounds normal. She exhibits tenderness.  Abdominal: Soft. Bowel sounds are normal. She exhibits no distension. There is no tenderness.  Musculoskeletal:       Arms:      Legs: Neurological: She is alert. She exhibits normal muscle tone. Coordination  normal.  Skin: Skin is warm and dry. No rash noted.    ED Course  Procedures (including critical care time)  Labs Reviewed  CBC WITH DIFFERENTIAL - Abnormal; Notable for the following:    RBC 3.67 (*)     Hemoglobin 10.7 (*)     HCT 33.3 (*)     Eosinophils Relative 6 (*)     All other components within normal limits  BASIC METABOLIC PANEL - Abnormal; Notable for the following:    Glucose, Bld 105 (*)     BUN 46 (*)     Creatinine, Ser 1.34 (*)     Calcium 10.9 (*)     GFR calc non Af Amer 36 (*)     GFR calc Af Amer 41 (*)     All other components within normal limits  URINALYSIS, ROUTINE W REFLEX MICROSCOPIC - Abnormal; Notable for the following:    Leukocytes, UA SMALL (*)     All other components within normal limits  URINE MICROSCOPIC-ADD ON - Abnormal; Notable for the following:    Squamous Epithelial / LPF FEW (*)     Casts HYALINE CASTS (*)  GRANULAR CAST   All other components within normal limits  URINE CULTURE   Dg Ribs Unilateral W/chest Right  01/15/2012  *RADIOLOGY REPORT*  Clinical Data: Injury post fall  RIGHT RIBS AND CHEST - 3+ VIEW  Comparison: 09/10/2010  Findings:  Four views submitted for interpretation. Cardiomediastinal silhouette is stable.  No acute infiltrate or pleural effusion.  No pulmonary edema. There is minimal displaced fracture of the right eighth rib.  There is displaced fracture of the right ninth ribs.  No diagnostic pneumothorax.  IMPRESSION: No acute infiltrate or pulmonary edema.  No  diagnostic pneumothorax.  Minimal displaced fracture of the right eighth rib. Displaced fracture of the right ninth rib.   Original Report Authenticated By: Natasha Mead, M.D.    Dg Pelvis 1-2 Views  01/15/2012  *RADIOLOGY REPORT*  Clinical Data: Fall.  Right sided pelvic pain.  PELVIS - 1-2 VIEW  Comparison: 09/06/2010.  Findings: The right hip is externally rotated.  There is no displaced femoral neck fracture.  The pelvic rings appear intact. Partially  visualized lumbar spondylosis.  If there is concern for right hip fracture, dedicated right hip films recommended with the toes taped together.  Sacral arcades appear within normal limits.  IMPRESSION: No acute osseous abnormality.   Original Report Authenticated By: Andreas Newport, M.D.    Ct Head Wo Contrast  01/15/2012  *RADIOLOGY REPORT*  Clinical Data: Fall  CT HEAD WITHOUT CONTRAST,CT MAXILLOFACIAL WITHOUT CONTRAST  Technique:  Contiguous axial images were obtained from the base of the skull through the vertex without contrast.,Technique: Multidetector CT imaging of the maxillofacial structures was performed. Multiplanar CT image reconstructions were also generated.  Comparison: 09/26/2011  Findings: The study is suboptimal due to the patient's head is tilted towards right.  No skull fracture is noted.  Mucosal thickening noted right maxillary sinus.  The mastoid air cells are unremarkable.  Table cerebral atrophy.  Stable periventricular white matter decreased attenuation consistent with chronic small vessel ischemic changes.  No definite acute cortical infarction.  No mass lesion is noted on this unenhanced scan.  IMPRESSION: No acute intracranial abnormality.  Mucosal thickening is noted right maxillary sinus.  Stable atrophy and chronic periventricular white matter disease.  CT maxillofacial without IV contrast:  Findings:  Metallic dental artifacts are noted.  Axial images shows no facial fractures.  Bilateral eye globe is symmetrical in appearance.  No orbital hematoma.  There is mucosal thickening right maxillary sinus.  No nasal bone fracture is noted.  No facial fluid collection is noted.  Coronal reconstructed images shows mild left nasal septum deviation.  Bilateral semilunar canal is patent.  Nasal turbinates are unremarkable.  No orbital rim or orbital floor fracture.  There is no zygomatic fracture.  No facial fluid collection is noted.  No mandibular fracture is noted.  No TMJ dislocation.   Degenerative changes noted bilateral TMJ.  Sagittal reconstructed images shows patent nasopharyngeal and oral pharyngeal airway.  Degenerative changes are noted in visualized upper cervical spine.  No prevertebral soft tissue swelling.  Mild atherosclerotic calcifications of carotid siphon.  No maxillary fracture is noted.  Impression: 1.  No facial fractures are noted. 2.  No nasal bone fracture.  No orbital rim or orbital floor fracture.  No orbital hematoma. 3.  There is left nasal septum deviation. 4.  Mucosal thickening noted right maxillary sinus.   Original Report Authenticated By: Natasha Mead, M.D.    Ct Maxillofacial Wo Cm  01/15/2012  *RADIOLOGY REPORT*  Clinical Data: Fall  CT HEAD WITHOUT CONTRAST,CT MAXILLOFACIAL WITHOUT CONTRAST  Technique:  Contiguous axial images were obtained from the base of the skull through the vertex without contrast.,Technique: Multidetector CT imaging of the maxillofacial structures was performed. Multiplanar CT image reconstructions were also generated.  Comparison: 09/26/2011  Findings: The study is suboptimal due to the patient's head is tilted towards right.  No skull fracture is noted.  Mucosal thickening noted right maxillary sinus.  The mastoid air cells are unremarkable.  Table cerebral atrophy.  Stable periventricular white matter decreased attenuation consistent with chronic small vessel ischemic changes.  No definite acute cortical infarction.  No mass lesion is noted on this unenhanced scan.  IMPRESSION: No acute intracranial abnormality.  Mucosal thickening is noted right maxillary sinus.  Stable atrophy and chronic periventricular white matter disease.  CT maxillofacial without IV contrast:  Findings:  Metallic dental artifacts are noted.  Axial images shows no facial fractures.  Bilateral eye globe is symmetrical in appearance.  No orbital hematoma.  There is mucosal thickening right maxillary sinus.  No nasal bone fracture is noted.  No facial fluid collection  is noted.  Coronal reconstructed images shows mild left nasal septum deviation.  Bilateral semilunar canal is patent.  Nasal turbinates are unremarkable.  No orbital rim or orbital floor fracture.  There is no zygomatic fracture.  No facial fluid collection is noted.  No mandibular fracture is noted.  No TMJ dislocation.  Degenerative changes noted bilateral TMJ.  Sagittal reconstructed images shows patent nasopharyngeal and oral pharyngeal airway.  Degenerative changes are noted in visualized upper cervical spine.  No prevertebral soft tissue swelling.  Mild atherosclerotic calcifications of carotid siphon.  No maxillary fracture is noted.  Impression: 1.  No facial fractures are noted. 2.  No nasal bone fracture.  No orbital rim or orbital floor fracture.  No orbital hematoma. 3.  There is left nasal septum deviation. 4.  Mucosal thickening noted right maxillary sinus.   Original Report Authenticated By: Natasha Mead, M.D.     Patient will be admitted for her dehydration and possible placement.     MDM  MDM Reviewed: nursing note and vitals Interpretation: labs, CT scan and x-ray Consults: admitting MD            Carlyle Dolly, PA-C 01/15/12 1652

## 2012-01-15 NOTE — H&P (Signed)
[facility]          History and Physical    PCP:   Billee Cashing, MD   Chief Complaint:  Falls  HPI: [name] is a 85/F with history of gait disorder, arthritis, p.Afib, Dementia, and frequent falls is brought to ER by her family. She has a gait disorder and is supposed to use a walker for > 1 year now They report increasing falls in the last week, approximately 4 times, often forgets to use walker , last fall yesterday, reports pain in R lower rib worse with inspiration and coughing. She denies any particular weakness or numbness on any side, denies any other complaints. Upon evaluation in ER noted to have a nondisplaced right 9th rib fracture and elevated creatinine of 1.34 and BUN compared to 0.8 from 7/13.   Allergies:   Allergies  Allergen Reactions  . Other Other (See Comments)    Reaction unknown: Mercurocrome.      Past Medical History  Diagnosis Date  . Stroke   . Arthritis   . Dementia     No past surgical history on file.  Prior to Admission medications   Medication Sig Start Date End Date Taking? Authorizing Provider  albuterol (PROVENTIL) (2.5 MG/3ML) 0.083% nebulizer solution Take 2.5 mg by nebulization every 4 (four) hours as needed. For shortness of breath   Yes Historical Provider, MD  allopurinol (ZYLOPRIM) 100 MG tablet Take 100 mg by mouth daily.   Yes Historical Provider, MD  aspirin 325 MG tablet Take 325 mg by mouth daily.   Yes Historical Provider, MD  Cholecalciferol (VITAMIN D PO) Take 1 tablet by mouth daily.   Yes Historical Provider, MD  diazepam (VALIUM) 5 MG tablet Take 5 mg by mouth every 12 (twelve) hours as needed. For anxiety   Yes Historical Provider, MD  donepezil (ARICEPT) 5 MG tablet Take 5 mg by mouth at bedtime.   Yes Historical Provider, MD  fluconazole (DIFLUCAN) 150 MG tablet Take 150 mg by mouth 2 (two) times a week.   Yes Historical Provider, MD  FLUoxetine (PROZAC) 20 MG capsule Take 20 mg by mouth  daily.   Yes Historical Provider, MD  fluticasone (FLONASE) 50 MCG/ACT nasal spray Place 2 sprays into the nose daily.   Yes Historical Provider, MD  furosemide (LASIX) 40 MG tablet Take 40 mg by mouth daily.   Yes Historical Provider, MD  gabapentin (NEURONTIN) 300 MG capsule Take 300-600 mg by mouth 2 (two) times daily. 1 cap in AM and 2 in PM   Yes Historical Provider, MD  HYDROcodone-acetaminophen (NORCO) 7.5-325 MG per tablet Take 1 tablet by mouth every 4 (four) hours as needed. For pain   Yes Historical Provider, MD  levothyroxine (SYNTHROID, LEVOTHROID) 50 MCG tablet Take 50 mcg by mouth daily.   Yes Historical Provider, MD  metoprolol succinate (TOPROL-XL) 25 MG 24 hr tablet Take 25 mg by mouth daily.   Yes Historical Provider, MD  risperiDONE (RISPERDAL) 1 MG tablet Take 1 mg by mouth at bedtime and may repeat dose one time if needed.   Yes Historical Provider, MD  simvastatin (ZOCOR) 10 MG tablet Take 10 mg by mouth at bedtime.   Yes Historical Provider, MD  tiotropium (SPIRIVA) 18 MCG inhalation capsule Place 18 mcg into inhaler and inhale daily.   Yes Historical Provider, MD    Social History:  does not have a smoking history on file. She does not have any smokeless tobacco history on file. Her  alcohol and drug histories not on file.  No family history on file.  Review of Systems:  Constitutional: Denies fever, chills, diaphoresis, appetite change and fatigue.  HEENT: Denies photophobia, eye pain, redness, hearing loss, ear pain, congestion, sore throat, rhinorrhea, sneezing, mouth sores, trouble swallowing, neck pain, neck stiffness and tinnitus.   Respiratory: Denies SOB, DOE, cough, chest tightness,  and wheezing.   Cardiovascular: Denies chest pain, palpitations and leg swelling.  Gastrointestinal: Denies nausea, vomiting, abdominal pain, diarrhea, constipation, blood in stool and abdominal distention.  Genitourinary: Denies dysuria, urgency, frequency, hematuria, flank pain  and difficulty urinating.  Musculoskeletal: Denies myalgias, back pain, joint swelling, arthralgias and gait problem.  Skin: Denies pallor, rash and wound.  Neurological: Denies dizziness, seizures, syncope, weakness, light-headedness, numbness and headaches.  Hematological: Denies adenopathy. Easy bruising, personal or family bleeding history  Psychiatric/Behavioral: Denies suicidal ideation, mood changes, confusion, nervousness, sleep disturbance and agitation   Physical Exam: Blood pressure 134/54, pulse 60, temperature 98.1 F (36.7 C), temperature source Oral, resp. rate 16, SpO2 100.00%. General: Alert and oriented times three, well developed and nourished, no distress HEENT: Bruises, echymoses and cuts on face ,: PERRLA, pink conjunctiva, no scleral icterus  ENT: Moist oral mucosa, neck supple, no thyromegaly  Lungs: clear to ascultation, no wheeze, no crackles, no use of accessory muscles  Cardiovascular: regular rate and rhythm, no regurgitation, no gallops, no murmurs. No carotid bruits, no JVD  Abdomen: soft, positive BS, , non-distended, no organomegaly, not an acute abdomen  GU: not examined  Neuro: CN II - XII grossly intact, sensation intact  Musculoskeletal: strength 5/5 all extremities, no clubbing, cyanosis or edema  Skin: no rash, no subcutaneous crepitation, no decubitus  Psych: appropriate patient   Labs on Admission:  Results for orders placed during the hospital encounter of 01/15/12 (from the past 48 hour(s))  CBC WITH DIFFERENTIAL     Status: Abnormal   Collection Time   01/15/12 11:16 AM      Component Value Range Comment   WBC 5.7  4.0 - 10.5 K/uL    RBC 3.67 (*) 3.87 - 5.11 MIL/uL    Hemoglobin 10.7 (*) 12.0 - 15.0 g/dL    HCT 16.1 (*) 09.6 - 46.0 %    MCV 90.7  78.0 - 100.0 fL    MCH 29.2  26.0 - 34.0 pg    MCHC 32.1  30.0 - 36.0 g/dL    RDW 04.5  40.9 - 81.1 %    Platelets 202  150 - 400 K/uL    Neutrophils Relative 63  43 - 77 %    Neutro Abs 3.6   1.7 - 7.7 K/uL    Lymphocytes Relative 22  12 - 46 %    Lymphs Abs 1.2  0.7 - 4.0 K/uL    Monocytes Relative 8  3 - 12 %    Monocytes Absolute 0.5  0.1 - 1.0 K/uL    Eosinophils Relative 6 (*) 0 - 5 %    Eosinophils Absolute 0.3  0.0 - 0.7 K/uL    Basophils Relative 1  0 - 1 %    Basophils Absolute 0.0  0.0 - 0.1 K/uL   BASIC METABOLIC PANEL     Status: Abnormal   Collection Time   01/15/12 11:16 AM      Component Value Range Comment   Sodium 137  135 - 145 mEq/L    Potassium 4.9  3.5 - 5.1 mEq/L    Chloride 100  96 -  112 mEq/L    CO2 25  19 - 32 mEq/L    Glucose, Bld 105 (*) 70 - 99 mg/dL    BUN 46 (*) 6 - 23 mg/dL    Creatinine, Ser 4.09 (*) 0.50 - 1.10 mg/dL    Calcium 81.1 (*) 8.4 - 10.5 mg/dL    GFR calc non Af Amer 36 (*) >90 mL/min    GFR calc Af Amer 41 (*) >90 mL/min   URINALYSIS, ROUTINE W REFLEX MICROSCOPIC     Status: Abnormal   Collection Time   01/15/12  1:44 PM      Component Value Range Comment   Color, Urine YELLOW  YELLOW    APPearance CLEAR  CLEAR    Specific Gravity, Urine 1.016  1.005 - 1.030    pH 5.0  5.0 - 8.0    Glucose, UA NEGATIVE  NEGATIVE mg/dL    Hgb urine dipstick NEGATIVE  NEGATIVE    Bilirubin Urine NEGATIVE  NEGATIVE    Ketones, ur NEGATIVE  NEGATIVE mg/dL    Protein, ur NEGATIVE  NEGATIVE mg/dL    Urobilinogen, UA 0.2  0.0 - 1.0 mg/dL    Nitrite NEGATIVE  NEGATIVE    Leukocytes, UA SMALL (*) NEGATIVE   URINE MICROSCOPIC-ADD ON     Status: Abnormal   Collection Time   01/15/12  1:44 PM      Component Value Range Comment   Squamous Epithelial / LPF FEW (*) RARE    WBC, UA 3-6  <3 WBC/hpf    RBC / HPF 0-2  <3 RBC/hpf    Bacteria, UA RARE  RARE    Casts HYALINE CASTS (*) NEGATIVE GRANULAR CAST    Radiological Exams on Admission: Dg Ribs Unilateral W/chest Right  01/15/2012  *RADIOLOGY REPORT*  Clinical Data: Injury post fall  RIGHT RIBS AND CHEST - 3+ VIEW  Comparison: 09/10/2010  Findings:  Four views submitted for interpretation.  Cardiomediastinal silhouette is stable.  No acute infiltrate or pleural effusion.  No pulmonary edema. There is minimal displaced fracture of the right eighth rib.  There is displaced fracture of the right ninth ribs.  No diagnostic pneumothorax.  IMPRESSION: No acute infiltrate or pulmonary edema.  No diagnostic pneumothorax.  Minimal displaced fracture of the right eighth rib. Displaced fracture of the right ninth rib.   Original Report Authenticated By: Natasha Mead, M.D.    Dg Pelvis 1-2 Views  01/15/2012  *RADIOLOGY REPORT*  Clinical Data: Fall.  Right sided pelvic pain.  PELVIS - 1-2 VIEW  Comparison: 09/06/2010.  Findings: The right hip is externally rotated.  There is no displaced femoral neck fracture.  The pelvic rings appear intact. Partially visualized lumbar spondylosis.  If there is concern for right hip fracture, dedicated right hip films recommended with the toes taped together.  Sacral arcades appear within normal limits.  IMPRESSION: No acute osseous abnormality.   Original Report Authenticated By: Andreas Newport, M.D.    Ct Head Wo Contrast  01/15/2012  *RADIOLOGY REPORT*  Clinical Data: Fall  CT HEAD WITHOUT CONTRAST,CT MAXILLOFACIAL WITHOUT CONTRAST  Technique:  Contiguous axial images were obtained from the base of the skull through the vertex without contrast.,Technique: Multidetector CT imaging of the maxillofacial structures was performed. Multiplanar CT image reconstructions were also generated.  Comparison: 09/26/2011  Findings: The study is suboptimal due to the patient's head is tilted towards right.  No skull fracture is noted.  Mucosal thickening noted right maxillary sinus.  The mastoid air cells are  unremarkable.  Table cerebral atrophy.  Stable periventricular white matter decreased attenuation consistent with chronic small vessel ischemic changes.  No definite acute cortical infarction.  No mass lesion is noted on this unenhanced scan.  IMPRESSION: No acute intracranial  abnormality.  Mucosal thickening is noted right maxillary sinus.  Stable atrophy and chronic periventricular white matter disease.  CT maxillofacial without IV contrast:  Findings:  Metallic dental artifacts are noted.  Axial images shows no facial fractures.  Bilateral eye globe is symmetrical in appearance.  No orbital hematoma.  There is mucosal thickening right maxillary sinus.  No nasal bone fracture is noted.  No facial fluid collection is noted.  Coronal reconstructed images shows mild left nasal septum deviation.  Bilateral semilunar canal is patent.  Nasal turbinates are unremarkable.  No orbital rim or orbital floor fracture.  There is no zygomatic fracture.  No facial fluid collection is noted.  No mandibular fracture is noted.  No TMJ dislocation.  Degenerative changes noted bilateral TMJ.  Sagittal reconstructed images shows patent nasopharyngeal and oral pharyngeal airway.  Degenerative changes are noted in visualized upper cervical spine.  No prevertebral soft tissue swelling.  Mild atherosclerotic calcifications of carotid siphon.  No maxillary fracture is noted.  Impression: 1.  No facial fractures are noted. 2.  No nasal bone fracture.  No orbital rim or orbital floor fracture.  No orbital hematoma. 3.  There is left nasal septum deviation. 4.  Mucosal thickening noted right maxillary sinus.   Original Report Authenticated By: Natasha Mead, M.D.    Ct Maxillofacial Wo Cm  01/15/2012  *RADIOLOGY REPORT*  Clinical Data: Fall  CT HEAD WITHOUT CONTRAST,CT MAXILLOFACIAL WITHOUT CONTRAST  Technique:  Contiguous axial images were obtained from the base of the skull through the vertex without contrast.,Technique: Multidetector CT imaging of the maxillofacial structures was performed. Multiplanar CT image reconstructions were also generated.  Comparison: 09/26/2011  Findings: The study is suboptimal due to the patient's head is tilted towards right.  No skull fracture is noted.  Mucosal thickening noted  right maxillary sinus.  The mastoid air cells are unremarkable.  Table cerebral atrophy.  Stable periventricular white matter decreased attenuation consistent with chronic small vessel ischemic changes.  No definite acute cortical infarction.  No mass lesion is noted on this unenhanced scan.  IMPRESSION: No acute intracranial abnormality.  Mucosal thickening is noted right maxillary sinus.  Stable atrophy and chronic periventricular white matter disease.  CT maxillofacial without IV contrast:  Findings:  Metallic dental artifacts are noted.  Axial images shows no facial fractures.  Bilateral eye globe is symmetrical in appearance.  No orbital hematoma.  There is mucosal thickening right maxillary sinus.  No nasal bone fracture is noted.  No facial fluid collection is noted.  Coronal reconstructed images shows mild left nasal septum deviation.  Bilateral semilunar canal is patent.  Nasal turbinates are unremarkable.  No orbital rim or orbital floor fracture.  There is no zygomatic fracture.  No facial fluid collection is noted.  No mandibular fracture is noted.  No TMJ dislocation.  Degenerative changes noted bilateral TMJ.  Sagittal reconstructed images shows patent nasopharyngeal and oral pharyngeal airway.  Degenerative changes are noted in visualized upper cervical spine.  No prevertebral soft tissue swelling.  Mild atherosclerotic calcifications of carotid siphon.  No maxillary fracture is noted.  Impression: 1.  No facial fractures are noted. 2.  No nasal bone fracture.  No orbital rim or orbital floor fracture.  No orbital hematoma. 3.  There is left nasal septum deviation. 4.  Mucosal thickening noted right maxillary sinus.   Original Report Authenticated By: Natasha Mead, M.D.     Assessment/Plan Active Problems: 1. Acute renal failure Suspect pre-renal from volume depletion/diuretics Hold lasix, gentle IVF  2. Rib fracture: tylenol PRN, incentive spirometry  3. Gait disorder/ Frequent falls Pt  eval, may benefit from Rehab CT head unremarkable  4. Dementia: stable, continue aricept  5. P.Afib: rate controlled, continue ASA, not on coumadin due to frequent falls  6. DVt Proph: lovenox  Code status: DNR Family communication: son at bedside Dispo: pending Pt/Ot eval, CSW/CM consult  Time Spent on Admission:  [facility] Pager: [phone] 01/15/2012, 4:20 PM

## 2012-01-15 NOTE — ED Notes (Signed)
Patient states she has some pain in her right side especially when she moves around or takes a deep breath. Pain is in her right lower lateral ribs. Denies feeling sob unless she gets nervous. Pt with multiple bruises to her face and arms and chest wall. She is pleasant answers questions appropriatly. Family likes to speak for the pt. She is aware she is being admitted and agreeable

## 2012-01-16 LAB — URINE CULTURE
Colony Count: NO GROWTH
Culture: NO GROWTH

## 2012-01-16 LAB — BASIC METABOLIC PANEL
CO2: 23 mEq/L (ref 19–32)
Chloride: 105 mEq/L (ref 96–112)
Glucose, Bld: 103 mg/dL — ABNORMAL HIGH (ref 70–99)
Potassium: 4.2 mEq/L (ref 3.5–5.1)
Sodium: 140 mEq/L (ref 135–145)

## 2012-01-16 LAB — CBC
Hemoglobin: 10.4 g/dL — ABNORMAL LOW (ref 12.0–15.0)
MCH: 28.8 pg (ref 26.0–34.0)
MCV: 92.2 fL (ref 78.0–100.0)
RBC: 3.61 MIL/uL — ABNORMAL LOW (ref 3.87–5.11)

## 2012-01-16 MED ORDER — FUROSEMIDE 20 MG PO TABS
20.0000 mg | ORAL_TABLET | Freq: Every day | ORAL | Status: DC
  Administered 2012-01-16 – 2012-01-17 (×2): 20 mg via ORAL
  Filled 2012-01-16 (×2): qty 1

## 2012-01-16 NOTE — Progress Notes (Signed)
Clinical Social Work Department BRIEF PSYCHOSOCIAL ASSESSMENT 01/16/2012  Patient:  [name], [name]     Account Number:  [id number]     Admit date:  01/15/2012  Clinical Social Worker:  Dennison Bulla  Date/Time:  01/16/2012 10:30 AM  Referred by:  Physician  Date Referred:  01/16/2012 Referred for  SNF Placement   Other Referral:   Interview type:  Family Other interview type:    PSYCHOSOCIAL DATA Living Status:  FAMILY Admitted from facility:   Level of care:   Primary support name:  Bruce Primary support relationship to patient:  CHILD, ADULT Degree of support available:   Strong    CURRENT CONCERNS Current Concerns  Post-Acute Placement   Other Concerns:    SOCIAL WORK ASSESSMENT / PLAN CSW received referral to assist with SNF placement. CSW reviewed chart and met with two sons Gala [place] and [place]). Patient was sleeping and did not participate in assessment.    CSW introduced myself and explained role. Patient has been living with son Smitty Cords) but son has recently had back surgery and is unable to care for patient. Son reports that patient has had several falls and is weak at this time. Sons have already started looking at facilities and are interested in [facility] or [facility]. CSW explained process and provided sons with SNF list. Sons already aware of Medicare benefits and copays. CSW spoke with sons about applying for Medicaid in case patient needs long term placement. Sons agreeable to faxing out information.    CSW submitted pasarr, completed FL2 and faxed out. CSW will follow up with bed offers.   Assessment/plan status:  Psychosocial Support/Ongoing Assessment of Needs Other assessment/ plan:   Information/referral to community resources:   Referral to Department of Social Services  SNF list    PATIENT'S/FAMILY'S RESPONSE TO PLAN OF CARE: Patient did not participate in assessment at this time due to being confused. Sons engaged throughout assessment and  appreciative of CSW consult. Sons agreeable to CSW follow up and has CSW contact information.

## 2012-01-16 NOTE — Progress Notes (Signed)
PATIENT DETAILS Name: [name] Age: 78 y.o. Sex: female Date of Birth: 06/30/1926 Admit Date: 01/15/2012 Admitting Physician Kristi Cove, MD WUJ:[id number], Kristi Lank, MD  Subjective: No major issues overnight. Admitted with recurrent falls  Assessment/Plan: Active Problems: Acute renal failure  -Suspect pre-renal from volume depletion/diuretics -resolved -KVO all IVF -resume Lasix-but decrease to 20 mg  Rib fracture -tylenol PRN, incentive spirometry  Gait disorder/ Frequent falls -likely mechanical -CT head unremarkable -SNF on discharge  Dementia:  -stable, continue aricept, riseperidal  P.Afib: - rate controlled, continue ASA, not on coumadin due to frequent falls  HTN -controlled with Metoprolol  Gout -stable-no evidence of flare -c/w Allopurinol  Dyslipidemia -statins  Hypothyroidism -c/w Levothyroxine  Disposition: Remain inpatient  DVT Prophylaxis: Prophylactic Lovenox   Code Status:  DNR  Procedures:  None  CONSULTS: None  PHYSICAL EXAM: Vital signs in last 24 hours: Filed Vitals:   01/15/12 2019 01/16/12 0550 01/16/12 1055 01/16/12 1101  BP: 130/73 145/69 133/70   Pulse: 68 102 95   Temp: 98.3 F (36.8 C) 97.6 F (36.4 C)    TempSrc: Oral Oral    Resp: 16 16    Height:    5\' 3"  (1.6 m)  SpO2: 95% 92%      Weight change:  There is no weight on file to calculate BMI.   Gen Exam: Awake and alert with clear speech.  Facial bruising Neck: Supple, No JVD.   Chest: B/L Clear.   CVS: S1 S2 Regular, no murmurs.  Abdomen: soft, BS +, non tender, non distended.  Extremities: no edema, lower extremities warm to touch. Neurologic: Non Focal.   Skin: No Rash.   Wounds: N/A.    Intake/Output from previous day:  Intake/Output Summary (Last 24 hours) at 01/16/12 1346 Last data filed at 01/16/12 0252  Gross per 24 hour  Intake 403.75 ml  Output      0 ml  Net 403.75 ml     LAB RESULTS: CBC  Lab 01/16/12 0502  01/15/12 1720 01/15/12 1116  WBC 5.5 5.8 5.7  HGB 10.4* 11.7* 10.7*  HCT 33.3* 36.2 33.3*  PLT 177 186 202  MCV 92.2 91.0 90.7  MCH 28.8 29.4 29.2  MCHC 31.2 32.3 32.1  RDW 14.6 14.5 14.5  LYMPHSABS -- -- 1.2  MONOABS -- -- 0.5  EOSABS -- -- 0.3  BASOSABS -- -- 0.0  BANDABS -- -- --    Chemistries   Lab 01/16/12 0502 01/15/12 1720 01/15/12 1116  NA 140 -- 137  K 4.2 -- 4.9  CL 105 -- 100  CO2 23 -- 25  GLUCOSE 103* -- 105*  BUN 34* -- 46*  CREATININE 1.04 1.21* 1.34*  CALCIUM 10.2 -- 10.9*  MG -- -- --    CBG: No results found for this basename: GLUCAP:5 in the last 168 hours  GFR The CrCl is unknown because both a height and weight (above a minimum accepted value) are required for this calculation.  Coagulation profile No results found for this basename: INR:5,PROTIME:5 in the last 168 hours  Cardiac Enzymes No results found for this basename: CK:3,CKMB:3,TROPONINI:3,MYOGLOBIN:3 in the last 168 hours  No components found with this basename: POCBNP:3 No results found for this basename: DDIMER:2 in the last 72 hours No results found for this basename: HGBA1C:2 in the last 72 hours No results found for this basename: CHOL:2,HDL:2,LDLCALC:2,TRIG:2,CHOLHDL:2,LDLDIRECT:2 in the last 72 hours No results found for this basename: TSH,T4TOTAL,FREET3,T3FREE,THYROIDAB in the last 72 hours No results found for  this basename: VITAMINB12:2,FOLATE:2,FERRITIN:2,TIBC:2,IRON:2,RETICCTPCT:2 in the last 72 hours No results found for this basename: LIPASE:2,AMYLASE:2 in the last 72 hours  Urine Studies No results found for this basename: UACOL:2,UAPR:2,USPG:2,UPH:2,UTP:2,UGL:2,UKET:2,UBIL:2,UHGB:2,UNIT:2,UROB:2,ULEU:2,UEPI:2,UWBC:2,URBC:2,UBAC:2,CAST:2,CRYS:2,UCOM:2,BILUA:2 in the last 72 hours  MICROBIOLOGY: No results found for this or any previous visit (from the past 240 hour(s)).  RADIOLOGY STUDIES/RESULTS: Dg Ribs Unilateral W/chest Right  01/15/2012  *RADIOLOGY REPORT*   Clinical Data: Injury post fall  RIGHT RIBS AND CHEST - 3+ VIEW  Comparison: 09/10/2010  Findings:  Four views submitted for interpretation. Cardiomediastinal silhouette is stable.  No acute infiltrate or pleural effusion.  No pulmonary edema. There is minimal displaced fracture of the right eighth rib.  There is displaced fracture of the right ninth ribs.  No diagnostic pneumothorax.  IMPRESSION: No acute infiltrate or pulmonary edema.  No diagnostic pneumothorax.  Minimal displaced fracture of the right eighth rib. Displaced fracture of the right ninth rib.   Original Report Authenticated By: Natasha Mead, M.D.    Dg Pelvis 1-2 Views  01/15/2012  *RADIOLOGY REPORT*  Clinical Data: Fall.  Right sided pelvic pain.  PELVIS - 1-2 VIEW  Comparison: 09/06/2010.  Findings: The right hip is externally rotated.  There is no displaced femoral neck fracture.  The pelvic rings appear intact. Partially visualized lumbar spondylosis.  If there is concern for right hip fracture, dedicated right hip films recommended with the toes taped together.  Sacral arcades appear within normal limits.  IMPRESSION: No acute osseous abnormality.   Original Report Authenticated By: Andreas Newport, M.D.    Ct Head Wo Contrast  01/15/2012  *RADIOLOGY REPORT*  Clinical Data: Fall  CT HEAD WITHOUT CONTRAST,CT MAXILLOFACIAL WITHOUT CONTRAST  Technique:  Contiguous axial images were obtained from the base of the skull through the vertex without contrast.,Technique: Multidetector CT imaging of the maxillofacial structures was performed. Multiplanar CT image reconstructions were also generated.  Comparison: 09/26/2011  Findings: The study is suboptimal due to the patient's head is tilted towards right.  No skull fracture is noted.  Mucosal thickening noted right maxillary sinus.  The mastoid air cells are unremarkable.  Table cerebral atrophy.  Stable periventricular white matter decreased attenuation consistent with chronic small vessel ischemic  changes.  No definite acute cortical infarction.  No mass lesion is noted on this unenhanced scan.  IMPRESSION: No acute intracranial abnormality.  Mucosal thickening is noted right maxillary sinus.  Stable atrophy and chronic periventricular white matter disease.  CT maxillofacial without IV contrast:  Findings:  Metallic dental artifacts are noted.  Axial images shows no facial fractures.  Bilateral eye globe is symmetrical in appearance.  No orbital hematoma.  There is mucosal thickening right maxillary sinus.  No nasal bone fracture is noted.  No facial fluid collection is noted.  Coronal reconstructed images shows mild left nasal septum deviation.  Bilateral semilunar canal is patent.  Nasal turbinates are unremarkable.  No orbital rim or orbital floor fracture.  There is no zygomatic fracture.  No facial fluid collection is noted.  No mandibular fracture is noted.  No TMJ dislocation.  Degenerative changes noted bilateral TMJ.  Sagittal reconstructed images shows patent nasopharyngeal and oral pharyngeal airway.  Degenerative changes are noted in visualized upper cervical spine.  No prevertebral soft tissue swelling.  Mild atherosclerotic calcifications of carotid siphon.  No maxillary fracture is noted.  Impression: 1.  No facial fractures are noted. 2.  No nasal bone fracture.  No orbital rim or orbital floor fracture.  No orbital hematoma. 3.  There  is left nasal septum deviation. 4.  Mucosal thickening noted right maxillary sinus.   Original Report Authenticated By: Natasha Mead, M.D.    Ct Maxillofacial Wo Cm  01/15/2012  *RADIOLOGY REPORT*  Clinical Data: Fall  CT HEAD WITHOUT CONTRAST,CT MAXILLOFACIAL WITHOUT CONTRAST  Technique:  Contiguous axial images were obtained from the base of the skull through the vertex without contrast.,Technique: Multidetector CT imaging of the maxillofacial structures was performed. Multiplanar CT image reconstructions were also generated.  Comparison: 09/26/2011  Findings:  The study is suboptimal due to the patient's head is tilted towards right.  No skull fracture is noted.  Mucosal thickening noted right maxillary sinus.  The mastoid air cells are unremarkable.  Table cerebral atrophy.  Stable periventricular white matter decreased attenuation consistent with chronic small vessel ischemic changes.  No definite acute cortical infarction.  No mass lesion is noted on this unenhanced scan.  IMPRESSION: No acute intracranial abnormality.  Mucosal thickening is noted right maxillary sinus.  Stable atrophy and chronic periventricular white matter disease.  CT maxillofacial without IV contrast:  Findings:  Metallic dental artifacts are noted.  Axial images shows no facial fractures.  Bilateral eye globe is symmetrical in appearance.  No orbital hematoma.  There is mucosal thickening right maxillary sinus.  No nasal bone fracture is noted.  No facial fluid collection is noted.  Coronal reconstructed images shows mild left nasal septum deviation.  Bilateral semilunar canal is patent.  Nasal turbinates are unremarkable.  No orbital rim or orbital floor fracture.  There is no zygomatic fracture.  No facial fluid collection is noted.  No mandibular fracture is noted.  No TMJ dislocation.  Degenerative changes noted bilateral TMJ.  Sagittal reconstructed images shows patent nasopharyngeal and oral pharyngeal airway.  Degenerative changes are noted in visualized upper cervical spine.  No prevertebral soft tissue swelling.  Mild atherosclerotic calcifications of carotid siphon.  No maxillary fracture is noted.  Impression: 1.  No facial fractures are noted. 2.  No nasal bone fracture.  No orbital rim or orbital floor fracture.  No orbital hematoma. 3.  There is left nasal septum deviation. 4.  Mucosal thickening noted right maxillary sinus.   Original Report Authenticated By: Natasha Mead, M.D.     MEDICATIONS: Scheduled Meds:   . allopurinol  100 mg Oral Daily  . aspirin  325 mg Oral Daily  .  donepezil  5 mg Oral QHS  . enoxaparin (LOVENOX) injection  30 mg Subcutaneous Q24H  . FLUoxetine  20 mg Oral Daily  . gabapentin  300 mg Oral Daily  . gabapentin  600 mg Oral QHS  . levothyroxine  50 mcg Oral QAC breakfast  . metoprolol succinate  25 mg Oral Daily  . [COMPLETED]  morphine injection  2 mg Intravenous Once  . risperiDONE  1 mg Oral QHS,MR X 1  . simvastatin  10 mg Oral QHS  . tiotropium  18 mcg Inhalation Daily  . [DISCONTINUED] gabapentin  300-600 mg Oral BID  . [DISCONTINUED] tiotropium  18 mcg Inhalation Daily   Continuous Infusions:   . sodium chloride 75 mL/hr at 01/16/12 1134   PRN Meds:.acetaminophen, acetaminophen, albuterol, diazepam, HYDROcodone-acetaminophen, ondansetron (ZOFRAN) IV, ondansetron  Antibiotics: Anti-infectives    None       Jeoffrey Massed, MD  [facility] Pager:[phone]  If 7PM-7AM, please contact night-coverage [email or web address] Password [email or web address] 01/16/2012, 1:46 PM   LOS: 1 day

## 2012-01-16 NOTE — Evaluation (Signed)
Physical Therapy Evaluation Patient Details Name: [name] MRN: [id number] DOB: 18-May-1926 Today's Date: 01/16/2012 Time: 4098-1191 PT Time Calculation (min): 21 min  PT Assessment / Plan / Recommendation Clinical Impression  Pt adm with frequent falls, rib fx's and acute renal failure. Needs skilled PT to maximize safety and help decr frequency of falls.  Recommend ST-SNF and family in agreement.    PT Assessment  Patient needs continued PT services    Follow Up Recommendations  SNF    Does the patient have the potential to tolerate intense rehabilitation      Barriers to Discharge        Equipment Recommendations  None recommended by PT    Recommendations for Other Services     Frequency Min 2X/week    Precautions / Restrictions Precautions Precautions: Fall   Pertinent Vitals/Pain Grimace with supine to sit.      Mobility  Bed Mobility Bed Mobility: Supine to Sit;Sitting - Scoot to Edge of Bed Supine to Sit: 2: Max assist Sitting - Scoot to [place] of Bed: 2: Max assist Details for Bed Mobility Assistance: Due to initial lethargy Transfers Transfers: Sit to Stand;Stand to Sit Sit to Stand: 4: Min assist;With upper extremity assist;From bed Stand to Sit: 4: Min assist;With upper extremity assist;To chair/3-in-1 Details for Transfer Assistance: Pt has hands on walker during transfer despite cuing. Ambulation/Gait Ambulation/Gait Assistance: 4: Min assist Ambulation Distance (Feet): 70 Feet Assistive device: Rolling walker Ambulation/Gait Assistance Details: Lt leg with less ground clearance and tends to lag behind. Gait Pattern: Step-through pattern;Decreased step length - left;Left flexed knee in stance;Trunk flexed    Shoulder Instructions     Exercises     PT Diagnosis: Difficulty walking;Abnormality of gait;Generalized weakness  PT Problem List: Decreased strength;Decreased balance;Decreased mobility;Decreased safety awareness;Decreased knowledge of  use of DME;Decreased knowledge of precautions PT Treatment Interventions: DME instruction;Gait training;Functional mobility training;Patient/family education;Therapeutic activities;Therapeutic exercise;Balance training   PT Goals Acute Rehab PT Goals PT Goal Formulation: With patient/family Time For Goal Achievement: 01/23/12 Potential to Achieve Goals: Good Pt will go Supine/Side to Sit: with min assist PT Goal: Supine/Side to Sit - Progress: Goal set today Pt will go Sit to Supine/Side: with min assist PT Goal: Sit to Supine/Side - Progress: Goal set today Pt will go Sit to Stand: with supervision PT Goal: Sit to Stand - Progress: Goal set today Pt will go Stand to Sit: with supervision PT Goal: Stand to Sit - Progress: Goal set today Pt will Ambulate: 51 - 150 feet;with supervision;with least restrictive assistive device PT Goal: Ambulate - Progress: Goal set today  Visit Information  Last PT Received On: 01/16/12 Assistance Needed: +1    Subjective Data  Subjective: "Four," pt stated accurately when I asked how many children she had. Patient Stated Goal: Pt didn't state.   Prior Functioning  Home Living Lives With: Son Available Help at Discharge: Skilled Nursing Facility Type of Home: Skilled Nursing Facility Home Adaptive Equipment: Walker - rolling Additional Comments: Has been living with son but has had frequent falls and son just had back surgery. Prior Function Level of Independence: Needs assistance Driving: No Vocation: Retired Comments: Pt gets up on her own at home but with multiple falls. Communication Communication: HOH    Cognition  Overall Cognitive Status: History of cognitive impairments - at baseline Arousal/Alertness: Lethargic (Initially but aroused with mobility.) Behavior During Session: [facility] for tasks performed    Extremity/Trunk Assessment Right Lower Extremity Assessment RLE ROM/Strength/Tone: Deficits RLE ROM/Strength/Tone  Deficits:  grossly 4/5 Left Lower Extremity Assessment LLE ROM/Strength/Tone: Deficits LLE ROM/Strength/Tone Deficits: grossly 3+/5   Balance Static Standing Balance Static Standing - Balance Support: Bilateral upper extremity supported (on walker) Static Standing - Level of Assistance: 4: Min assist  End of Session PT - End of Session Equipment Utilized During Treatment: Gait belt Activity Tolerance: Patient tolerated treatment well Patient left: in chair;with call bell/phone within reach;with family/visitor present Nurse Communication: Mobility status  GP     [name] 01/16/2012, 11:52 AM  [employer] PT [phone]

## 2012-01-16 NOTE — Progress Notes (Signed)
Clinical Social Work  CSW received bed offers and gave those offers to sons. Sons agreeable to dc to [facility] when medically stable. [facility] reports available bed tomorrow if ready. CSW informed MD of plans. CSW will continue to follow.  [place], [place] [phone]

## 2012-01-16 NOTE — ED Provider Notes (Signed)
Medical screening examination/treatment/procedure(s) were performed by non-physician practitioner and as supervising physician I was immediately available for consultation/collaboration.  Juliet Rude. Rubin Payor, MD 01/16/12 [phone]

## 2012-01-16 NOTE — Progress Notes (Addendum)
Clinical Social Work Department CLINICAL SOCIAL WORK PLACEMENT NOTE 01/16/2012  Patient:  [name], [name]  Account Number:  [id number] Admit date:  01/15/2012  Clinical Social Worker:  Unk Lightning, LCSW  Date/time:  01/16/2012 10:30 AM  Clinical Social Work is seeking post-discharge placement for this patient at the following level of care:   SKILLED NURSING   (*CSW will update this form in Epic as items are completed)   01/16/2012  Patient/family provided with [facility] Department of Clinical Social Work's list of facilities offering this level of care within the geographic area requested by the patient (or if unable, by the patient's family).  01/16/2012  Patient/family informed of their freedom to choose among providers that offer the needed level of care, that participate in Medicare, Medicaid or managed care program needed by the patient, have an available bed and are willing to accept the patient.  01/16/2012  Patient/family informed of [facility]' ownership interest in [facility], as well as of the fact that they are under no obligation to receive care at this facility.  PASARR submitted to EDS on 01/16/2012 PASARR number received from EDS on 01/16/2012  FL2 transmitted to all facilities in geographic area requested by pt/family on  01/16/2012 FL2 transmitted to all facilities within larger geographic area on   Patient informed that his/her managed care company has contracts with or will negotiate with  certain facilities, including the following:     Patient/family informed of bed offers received:  01/16/2012 Patient chooses bed at [facility] Physician recommends and patient chooses bed at    Patient to be transferred to [facility] on  01/17/12 Patient to be transferred to facility by [facility]  The following physician request were entered in Epic:   Additional Comments:

## 2012-01-17 MED ORDER — ACETAMINOPHEN 325 MG PO TABS: 650.0000 mg | ORAL_TABLET | Freq: Four times a day (QID) | ORAL | Status: AC | PRN

## 2012-01-17 MED ORDER — DIAZEPAM 5 MG PO TABS: 5.0000 mg | ORAL_TABLET | Freq: Two times a day (BID) | ORAL | Status: DC | PRN

## 2012-01-17 MED ORDER — FUROSEMIDE 40 MG PO TABS: 20.0000 mg | ORAL_TABLET | Freq: Every day | ORAL | Status: DC

## 2012-01-17 NOTE — Progress Notes (Signed)
Pt prepared for d/c to SNF. IV d/c'd. Skin intact except as most recently charted. Vitals are stable. Report called to receiving facility. Pt to be transported by ambulance service. 

## 2012-01-17 NOTE — Discharge Summary (Signed)
PATIENT DETAILS Name: [name] Age: 78 y.o. Sex: female Date of Birth: [birth year] MRN: [id number]. Admit Date: 01/15/2012 Admitting Physician: [name], MD WUJ:[id number], Kristi Lank, MD  Recommendations for Outpatient Follow-up:  Check lytes periodically  PRIMARY DISCHARGE DIAGNOSIS:  Active Problems:  Acute renal failure  Gait disorder  Falls frequently  Rib fracture  Dementia      PAST MEDICAL HISTORY: Past Medical History  Diagnosis Date  . Stroke   . Arthritis   . Dementia   . Pneumonia   . Multiple falls   . Myocardial infarction   . Anginal pain     "once in awhile" (01/15/2012)  . Asthma   . Shortness of breath     "any time" (01/15/2012)  . Hypothyroidism   . Hypercholesteremia   . Hypertension   . Skin cancer     "arms, face, maybe on my right big toe" (01/15/2012)  . Dizzinesses   . On home oxygen therapy   . Depression     DISCHARGE MEDICATIONS:   Medication List     As of 01/17/2012 10:23 AM    STOP taking these medications         fluconazole 150 MG tablet   Commonly known as: DIFLUCAN      TAKE these medications         acetaminophen 325 MG tablet   Commonly known as: TYLENOL   Take 2 tablets (650 mg total) by mouth every 6 (six) hours as needed for pain (or Fever >/= 101).      albuterol (2.5 MG/3ML) 0.083% nebulizer solution   Commonly known as: PROVENTIL   Take 2.5 mg by nebulization every 4 (four) hours as needed. For shortness of breath      allopurinol 100 MG tablet   Commonly known as: ZYLOPRIM   Take 100 mg by mouth daily.      aspirin 325 MG tablet   Take 325 mg by mouth daily.      diazepam 5 MG tablet   Commonly known as: VALIUM   Take 5 mg by mouth every 12 (twelve) hours as needed. For anxiety      donepezil 5 MG tablet   Commonly known as: ARICEPT   Take 5 mg by mouth at bedtime.      FLUoxetine 20 MG capsule   Commonly known as: PROZAC   Take 20 mg by mouth daily.      fluticasone 50 MCG/ACT nasal  spray   Commonly known as: FLONASE   Place 2 sprays into the nose daily.      furosemide 40 MG tablet   Commonly known as: LASIX   Take 0.5 tablets (20 mg total) by mouth daily.      gabapentin 300 MG capsule   Commonly known as: NEURONTIN   Take 300-600 mg by mouth 2 (two) times daily. 1 cap in AM and 2 in PM      HYDROcodone-acetaminophen 7.5-325 MG per tablet   Commonly known as: NORCO   Take 1 tablet by mouth every 4 (four) hours as needed. For pain      levothyroxine 50 MCG tablet   Commonly known as: SYNTHROID, LEVOTHROID   Take 50 mcg by mouth daily.      metoprolol succinate 25 MG 24 hr tablet   Commonly known as: TOPROL-XL   Take 25 mg by mouth daily.      risperiDONE 1 MG tablet   Commonly known as: RISPERDAL   Take 1 mg  by mouth at bedtime and may repeat dose one time if needed.      simvastatin 10 MG tablet   Commonly known as: ZOCOR   Take 10 mg by mouth at bedtime.      tiotropium 18 MCG inhalation capsule   Commonly known as: SPIRIVA   Place 18 mcg into inhaler and inhale daily.      VITAMIN D PO   Take 1 tablet by mouth daily.         BRIEF HPI:  See H&P, Labs, Consult and Test reports for all details in brief,Ms.Dolin is a 78/F with history of gait disorder, arthritis, p.Afib, Dementia, and frequent falls is brought to ER by her family. She has a gait disorder and is supposed to use a walker for > 1 year now  They report increasing falls in the last week, approximately 4 times, often forgets to use walker , last fall yesterday, reports pain in R lower rib worse with inspiration and coughing.  She denies any particular weakness or numbness on any side, denies any other complaints.  Upon evaluation in ER noted to have a nondisplaced right 9th rib fracture and elevated creatinine of 1.34 and BUN compared to 0.8 from 7/13.    CONSULTATIONS:  None  PERTINENT RADIOLOGIC STUDIES: Dg Ribs Unilateral W/chest Right  01/15/2012  *RADIOLOGY REPORT*   Clinical Data: Injury post fall  RIGHT RIBS AND CHEST - 3+ VIEW  Comparison: 09/10/2010  Findings:  Four views submitted for interpretation. Cardiomediastinal silhouette is stable.  No acute infiltrate or pleural effusion.  No pulmonary edema. There is minimal displaced fracture of the right eighth rib.  There is displaced fracture of the right ninth ribs.  No diagnostic pneumothorax.  IMPRESSION: No acute infiltrate or pulmonary edema.  No diagnostic pneumothorax.  Minimal displaced fracture of the right eighth rib. Displaced fracture of the right ninth rib.   Original Report Authenticated By: Natasha Mead, M.D.    Dg Pelvis 1-2 Views  01/15/2012  *RADIOLOGY REPORT*  Clinical Data: Fall.  Right sided pelvic pain.  PELVIS - 1-2 VIEW  Comparison: 09/06/2010.  Findings: The right hip is externally rotated.  There is no displaced femoral neck fracture.  The pelvic rings appear intact. Partially visualized lumbar spondylosis.  If there is concern for right hip fracture, dedicated right hip films recommended with the toes taped together.  Sacral arcades appear within normal limits.  IMPRESSION: No acute osseous abnormality.   Original Report Authenticated By: Andreas Newport, M.D.    Ct Head Wo Contrast  01/15/2012  *RADIOLOGY REPORT*  Clinical Data: Fall  CT HEAD WITHOUT CONTRAST,CT MAXILLOFACIAL WITHOUT CONTRAST  Technique:  Contiguous axial images were obtained from the base of the skull through the vertex without contrast.,Technique: Multidetector CT imaging of the maxillofacial structures was performed. Multiplanar CT image reconstructions were also generated.  Comparison: 09/26/2011  Findings: The study is suboptimal due to the patient's head is tilted towards right.  No skull fracture is noted.  Mucosal thickening noted right maxillary sinus.  The mastoid air cells are unremarkable.  Table cerebral atrophy.  Stable periventricular white matter decreased attenuation consistent with chronic small vessel ischemic  changes.  No definite acute cortical infarction.  No mass lesion is noted on this unenhanced scan.  IMPRESSION: No acute intracranial abnormality.  Mucosal thickening is noted right maxillary sinus.  Stable atrophy and chronic periventricular white matter disease.  CT maxillofacial without IV contrast:  Findings:  Metallic dental artifacts are noted.  Axial images shows no facial fractures.  Bilateral eye globe is symmetrical in appearance.  No orbital hematoma.  There is mucosal thickening right maxillary sinus.  No nasal bone fracture is noted.  No facial fluid collection is noted.  Coronal reconstructed images shows mild left nasal septum deviation.  Bilateral semilunar canal is patent.  Nasal turbinates are unremarkable.  No orbital rim or orbital floor fracture.  There is no zygomatic fracture.  No facial fluid collection is noted.  No mandibular fracture is noted.  No TMJ dislocation.  Degenerative changes noted bilateral TMJ.  Sagittal reconstructed images shows patent nasopharyngeal and oral pharyngeal airway.  Degenerative changes are noted in visualized upper cervical spine.  No prevertebral soft tissue swelling.  Mild atherosclerotic calcifications of carotid siphon.  No maxillary fracture is noted.  Impression: 1.  No facial fractures are noted. 2.  No nasal bone fracture.  No orbital rim or orbital floor fracture.  No orbital hematoma. 3.  There is left nasal septum deviation. 4.  Mucosal thickening noted right maxillary sinus.   Original Report Authenticated By: Natasha Mead, M.D.    Ct Maxillofacial Wo Cm  01/15/2012  *RADIOLOGY REPORT*  Clinical Data: Fall  CT HEAD WITHOUT CONTRAST,CT MAXILLOFACIAL WITHOUT CONTRAST  Technique:  Contiguous axial images were obtained from the base of the skull through the vertex without contrast.,Technique: Multidetector CT imaging of the maxillofacial structures was performed. Multiplanar CT image reconstructions were also generated.  Comparison: 09/26/2011  Findings:  The study is suboptimal due to the patient's head is tilted towards right.  No skull fracture is noted.  Mucosal thickening noted right maxillary sinus.  The mastoid air cells are unremarkable.  Table cerebral atrophy.  Stable periventricular white matter decreased attenuation consistent with chronic small vessel ischemic changes.  No definite acute cortical infarction.  No mass lesion is noted on this unenhanced scan.  IMPRESSION: No acute intracranial abnormality.  Mucosal thickening is noted right maxillary sinus.  Stable atrophy and chronic periventricular white matter disease.  CT maxillofacial without IV contrast:  Findings:  Metallic dental artifacts are noted.  Axial images shows no facial fractures.  Bilateral eye globe is symmetrical in appearance.  No orbital hematoma.  There is mucosal thickening right maxillary sinus.  No nasal bone fracture is noted.  No facial fluid collection is noted.  Coronal reconstructed images shows mild left nasal septum deviation.  Bilateral semilunar canal is patent.  Nasal turbinates are unremarkable.  No orbital rim or orbital floor fracture.  There is no zygomatic fracture.  No facial fluid collection is noted.  No mandibular fracture is noted.  No TMJ dislocation.  Degenerative changes noted bilateral TMJ.  Sagittal reconstructed images shows patent nasopharyngeal and oral pharyngeal airway.  Degenerative changes are noted in visualized upper cervical spine.  No prevertebral soft tissue swelling.  Mild atherosclerotic calcifications of carotid siphon.  No maxillary fracture is noted.  Impression: 1.  No facial fractures are noted. 2.  No nasal bone fracture.  No orbital rim or orbital floor fracture.  No orbital hematoma. 3.  There is left nasal septum deviation. 4.  Mucosal thickening noted right maxillary sinus.   Original Report Authenticated By: Natasha Mead, M.D.      PERTINENT LAB RESULTS: CBC:  Basename 01/16/12 0502 01/15/12 1720  WBC 5.5 5.8  HGB 10.4*  11.7*  HCT 33.3* 36.2  PLT 177 186   CMET CMP     Component Value Date/Time   NA 140 01/16/2012 0502  K 4.2 01/16/2012 0502   CL 105 01/16/2012 0502   CO2 23 01/16/2012 0502   GLUCOSE 103* 01/16/2012 0502   BUN 34* 01/16/2012 0502   CREATININE 1.04 01/16/2012 0502   CALCIUM 10.2 01/16/2012 0502   PROT 6.9 04/04/2010 1855   ALBUMIN 4.0 04/04/2010 1855   AST 25 04/04/2010 1855   ALT 10 04/04/2010 1855   ALKPHOS 88 04/04/2010 1855   BILITOT 0.3 04/04/2010 1855   GFRNONAA 48* 01/16/2012 0502   GFRAA 56* 01/16/2012 0502    GFR Estimated Creatinine Clearance: 37.5 ml/min (by C-G formula based on Cr of 1.04). No results found for this basename: LIPASE:2,AMYLASE:2 in the last 72 hours No results found for this basename: CKTOTAL:3,CKMB:3,CKMBINDEX:3,TROPONINI:3 in the last 72 hours No components found with this basename: POCBNP:3 No results found for this basename: DDIMER:2 in the last 72 hours No results found for this basename: HGBA1C:2 in the last 72 hours No results found for this basename: CHOL:2,HDL:2,LDLCALC:2,TRIG:2,CHOLHDL:2,LDLDIRECT:2 in the last 72 hours No results found for this basename: TSH,T4TOTAL,FREET3,T3FREE,THYROIDAB in the last 72 hours No results found for this basename: VITAMINB12:2,FOLATE:2,FERRITIN:2,TIBC:2,IRON:2,RETICCTPCT:2 in the last 72 hours Coags: No results found for this basename: PT:2,INR:2 in the last 72 hours Microbiology: Recent Results (from the past 240 hour(s))  URINE CULTURE     Status: Normal   Collection Time   01/15/12  1:44 PM      Component Value Range Status Comment   Specimen Description URINE, CATHETERIZED   Final    Special Requests NONE   Final    Culture  Setup Time 01/15/2012 14:21   Final    Colony Count NO GROWTH   Final    Culture NO GROWTH   Final    Report Status 01/16/2012 FINAL   Final      BRIEF HOSPITAL COURSE:   Active Problems:  Acute renal failure  -Suspect pre-renal from volume depletion/diuretics  -resolved  -resume  Lasix-but decrease to 20 mg   Rib fracture  -tylenol PRN, incentive spirometry   Gait disorder/ Frequent falls  -likely mechanical  -CT head unremarkable  -family does not want to pursue aggressive testing-agree as would not change the overall outcome here  Dementia:  -stable, continue aricept, riseperidal   P.Afib:  - rate controlled, continue ASA, not on coumadin due to frequent falls   HTN  -controlled with Metoprolol   Gout  -stable-no evidence of flare  -c/w Allopurinol   Dyslipidemia  -statins   Hypothyroidism  -c/w Levothyroxine   TODAY-DAY OF DISCHARGE:  Subjective:   [name] today has no headache,no chest abdominal pain,no new weakness tingling or numbness, feels much better wants to go home today.   Objective:   Blood pressure 179/63, pulse 70, temperature 98.1 F (36.7 C), temperature source Oral, resp. rate 18, height 5\' 3"  (1.6 m), weight 66.225 kg (146 lb), SpO2 94.00%.  Intake/Output Summary (Last 24 hours) at 01/17/12 1023 Last data filed at 01/17/12 0656  Gross per 24 hour  Intake 1072.58 ml  Output      0 ml  Net 1072.58 ml    Exam Awake Alert, Oriented *3, No new F.N deficits, Normal affect [place].AT,PERRAL. Facial bruising resolving Supple Neck,No JVD, No cervical lymphadenopathy appriciated.  Symmetrical Chest wall movement, Good air movement bilaterally, CTAB RRR,No Gallops,Rubs or new Murmurs, No Parasternal Heave +ve B.Sounds, Abd Soft, Non tender, No organomegaly appriciated, No rebound -guarding or rigidity. No Cyanosis, Clubbing or edema, No new Rash or bruise  DISCHARGE CONDITION: Stable  DISPOSITION: SNF   DISCHARGE INSTRUCTIONS:    Activity:  As tolerated with Full fall precautions use walker/cane & assistance as needed  Diet recommendation: Heart Healthy diet  Code Status DNR  Total Time spent on discharge equals 45 minutes.  SignedJeoffrey Massed 01/17/2012 10:23 AM

## 2012-01-17 NOTE — Care Management Note (Signed)
    Page 1 of 1   01/17/2012     11:10:21 AM   CARE MANAGEMENT NOTE 01/17/2012  Patient:  KAMBERLYN, LECHLER   Account Number:  [id number]  Date Initiated:  01/17/2012  Documentation initiated by:  Letha Cape  Subjective/Objective Assessment:   dx acute renal failure  admoit- lives with family.     Action/Plan:   Anticipated DC Date:  01/17/2012   Anticipated DC Plan:  SKILLED NURSING FACILITY  In-house referral  Clinical Social Worker      DC Planning Services  CM consult      Choice offered to / List presented to:             Status of service:  In process, will continue to follow Medicare Important Message given?   (If response is "NO", the following Medicare IM given date fields will be blank) Date Medicare IM given:   Date Additional Medicare IM given:    Discharge Disposition:  SKILLED NURSING FACILITY  Per UR Regulation:  Reviewed for med. necessity/level of care/duration of stay  If discussed at Long Length of Stay Meetings, dates discussed:    Comments:  01/17/12 11:07 Letha Cape RN, BSN [phone] patient for dc to [facility] today, CSW following.

## 2012-02-18 NOTE — Clinical Social Work Psychosocial (Signed)
     Clinical Social Work Department BRIEF PSYCHOSOCIAL ASSESSMENT 02/18/2012  Patient:  [name], [name]     Account Number:  [id number]     Admit date:  01/15/2012  Clinical Social Worker:  Dennison Bulla  Date/Time:  01/16/2012 10:30 AM  Referred by:  Physician  Date Referred:  01/16/2012 Referred for  SNF Placement   Other Referral:   Interview type:  Family Other interview type:    PSYCHOSOCIAL DATA Living Status:  FAMILY Admitted from facility:   Level of care:   Primary support name:  Bruce Primary support relationship to patient:  CHILD, ADULT Degree of support available:   Strong    CURRENT CONCERNS Current Concerns  Post-Acute Placement   Other Concerns:    SOCIAL WORK ASSESSMENT / PLAN CSW received referral to assist with SNF placement. CSW reviewed chart and met with two sons Gala Romney and [place]). Patient was sleeping and did not participate in assessment.    CSW introduced myself and explained role. Patient has been living with son Smitty Cords) but son has recently had back surgery and is unable to care for patient. Son reports that patient has had several falls and is weak at this time. Sons have already started looking at facilities and are interested in [facility] or [facility]. CSW explained process and provided sons with SNF list. Sons already aware of Medicare benefits and copays. CSW spoke with sons about applying for Medicaid in case patient needs long term placement. Sons agreeable to faxing out information.    CSW submitted pasarr, completed FL2 and faxed out. CSW will follow up with bed offers.   Assessment/plan status:  Psychosocial Support/Ongoing Assessment of Needs Other assessment/ plan:   Information/referral to community resources:   Referral to Department of Social Services  SNF list    PATIENTS/FAMILYS RESPONSE TO PLAN OF CARE: Patient did not participate in assessment at this time due to being confused. Sons engaged throughout assessment and  appreciative of CSW consult. Sons agreeable to CSW follow up and has CSW contact information.

## 2012-02-18 NOTE — Clinical Social Work Placement (Addendum)
     Clinical Social Work Department CLINICAL SOCIAL WORK PLACEMENT NOTE 02/18/2012  Patient:  [name], [name]  Account Number:  [id number] Admit date:  01/15/2012  Clinical Social Worker:  Robin Searing  Date/time:  02/18/2012 10:27 AM  Clinical Social Work is seeking post-discharge placement for this patient at the following level of care:   SKILLED NURSING   (*CSW will update this form in Epic as items are completed)   02/17/2012  Patient/family provided with [facility] Department of Clinical Social Works list of facilities offering this level of care within the geographic area requested by the patient (or if unable, by the patients family).  02/17/2012  Patient/family informed of their freedom to choose among providers that offer the needed level of care, that participate in Medicare, Medicaid or managed care program needed by the patient, have an available bed and are willing to accept the patient.  02/17/2012  Patient/family informed of [facility] ownership interest in [facility], as well as of the fact that they are under no obligation to receive care at this facility.  PASARR submitted to EDS on 02/17/2012 PASARR number received from EDS on 02/17/2012  FL2 transmitted to all facilities in geographic area requested by pt/family on  02/17/2012 FL2 transmitted to all facilities within larger geographic area on   Patient informed that his/her managed care company has contracts with or will negotiate with  certain facilities, including the following:     Patient/family informed of bed offers received:  02/18/2012 Patient chooses bed at [facility] Physician recommends and patient chooses bed at    Patient to be transferred to  on   Patient to be transferred to facility by   The following physician request were entered in Epic:   Additional Comments:

## 2012-02-18 NOTE — Clinical Social Work Placement (Signed)
     Clinical Social Work Department CLINICAL SOCIAL WORK PLACEMENT NOTE 02/18/2012  Patient:  [name], [name]  Account Number:  [id number] Admit date:  01/15/2012  Clinical Social Worker:  Robin Searing  Date/time:  02/18/2012 10:45 AM  Clinical Social Work is seeking post-discharge placement for this patient at the following level of care:   SKILLED NURSING   (*CSW will update this form in Epic as items are completed)   02/18/2012  Patient/family provided with [facility] Department of Clinical Social Works list of facilities offering this level of care within the geographic area requested by the patient (or if unable, by the patients family).  02/18/2012  Patient/family informed of their freedom to choose among providers that offer the needed level of care, that participate in Medicare, Medicaid or managed care program needed by the patient, have an available bed and are willing to accept the patient.  02/18/2012  Patient/family informed of [facility] ownership interest in [facility], as well as of the fact that they are under no obligation to receive care at this facility.  PASARR submitted to EDS on 02/18/2012 PASARR number received from EDS on   FL2 transmitted to all facilities in geographic area requested by pt/family on  02/18/2012 FL2 transmitted to all facilities within larger geographic area on   Patient informed that his/her managed care company has contracts with or will negotiate with  certain facilities, including the following:     Patient/family informed of bed offers received:   Patient chooses bed at  Physician recommends and patient chooses bed at    Patient to be transferred to  on   Patient to be transferred to facility by   The following physician request were entered in Epic:   Additional Comments:

## 2012-04-14 ENCOUNTER — Inpatient Hospital Stay (HOSPITAL_COMMUNITY)
Admission: EM | Admit: 2012-04-14 | Discharge: 2012-04-17 | DRG: 070 | Disposition: A | Discharge status: Skilled Nursing Facility | Payer: Medicare Other | Attending: Internal Medicine | Admitting: Internal Medicine

## 2012-04-14 ENCOUNTER — Inpatient Hospital Stay (HOSPITAL_COMMUNITY): Payer: Medicare Other

## 2012-04-14 ENCOUNTER — Emergency Department (HOSPITAL_COMMUNITY): Payer: Medicare Other

## 2012-04-14 ENCOUNTER — Encounter (HOSPITAL_COMMUNITY): Payer: Self-pay

## 2012-04-14 DIAGNOSIS — Y92009 Unspecified place in unspecified non-institutional (private) residence as the place of occurrence of the external cause: Secondary | ICD-10-CM

## 2012-04-14 DIAGNOSIS — G9340 Encephalopathy, unspecified: Secondary | ICD-10-CM

## 2012-04-14 DIAGNOSIS — R197 Diarrhea, unspecified: Secondary | ICD-10-CM | POA: Diagnosis present

## 2012-04-14 DIAGNOSIS — J45909 Unspecified asthma, uncomplicated: Secondary | ICD-10-CM | POA: Diagnosis present

## 2012-04-14 DIAGNOSIS — R269 Unspecified abnormalities of gait and mobility: Secondary | ICD-10-CM | POA: Diagnosis present

## 2012-04-14 DIAGNOSIS — J209 Acute bronchitis, unspecified: Secondary | ICD-10-CM | POA: Diagnosis present

## 2012-04-14 DIAGNOSIS — E039 Hypothyroidism, unspecified: Secondary | ICD-10-CM | POA: Diagnosis present

## 2012-04-14 DIAGNOSIS — R296 Repeated falls: Secondary | ICD-10-CM

## 2012-04-14 DIAGNOSIS — G9349 Other encephalopathy: Principal | ICD-10-CM | POA: Diagnosis present

## 2012-04-14 DIAGNOSIS — J189 Pneumonia, unspecified organism: Secondary | ICD-10-CM | POA: Diagnosis present

## 2012-04-14 DIAGNOSIS — F0390 Unspecified dementia without behavioral disturbance: Secondary | ICD-10-CM | POA: Diagnosis present

## 2012-04-14 DIAGNOSIS — S0101XA Laceration without foreign body of scalp, initial encounter: Secondary | ICD-10-CM

## 2012-04-14 DIAGNOSIS — Z7982 Long term (current) use of aspirin: Secondary | ICD-10-CM

## 2012-04-14 DIAGNOSIS — I5032 Chronic diastolic (congestive) heart failure: Secondary | ICD-10-CM | POA: Diagnosis present

## 2012-04-14 DIAGNOSIS — W19XXXA Unspecified fall, initial encounter: Secondary | ICD-10-CM | POA: Diagnosis present

## 2012-04-14 DIAGNOSIS — Z87891 Personal history of nicotine dependence: Secondary | ICD-10-CM

## 2012-04-14 DIAGNOSIS — Z8673 Personal history of transient ischemic attack (TIA), and cerebral infarction without residual deficits: Secondary | ICD-10-CM

## 2012-04-14 DIAGNOSIS — S0100XA Unspecified open wound of scalp, initial encounter: Secondary | ICD-10-CM

## 2012-04-14 DIAGNOSIS — I252 Old myocardial infarction: Secondary | ICD-10-CM

## 2012-04-14 DIAGNOSIS — N179 Acute kidney failure, unspecified: Secondary | ICD-10-CM

## 2012-04-14 DIAGNOSIS — R627 Adult failure to thrive: Secondary | ICD-10-CM | POA: Diagnosis present

## 2012-04-14 DIAGNOSIS — Z9181 History of falling: Secondary | ICD-10-CM

## 2012-04-14 DIAGNOSIS — Z79899 Other long term (current) drug therapy: Secondary | ICD-10-CM

## 2012-04-14 DIAGNOSIS — I10 Essential (primary) hypertension: Secondary | ICD-10-CM | POA: Diagnosis present

## 2012-04-14 LAB — HEPATIC FUNCTION PANEL
ALT: 17 U/L (ref 0–35)
AST: 31 U/L (ref 0–37)
Bilirubin, Direct: <0.1 mg/dL (ref 0.0–0.3)
Total Bilirubin: 0.2 mg/dL — ABNORMAL LOW (ref 0.3–1.2)

## 2012-04-14 LAB — URINALYSIS, ROUTINE W REFLEX MICROSCOPIC
Glucose, UA: NEGATIVE mg/dL
Hgb urine dipstick: NEGATIVE
Protein, ur: NEGATIVE mg/dL
pH: 5 (ref 5.0–8.0)

## 2012-04-14 LAB — POCT I-STAT, CHEM 8
BUN: 18 mg/dL (ref 6–23)
Calcium, Ion: 1.37 mmol/L — ABNORMAL HIGH (ref 1.13–1.30)
Chloride: 105 mEq/L (ref 96–112)
Creatinine, Ser: 1.1 mg/dL (ref 0.50–1.10)
TCO2: 27 mmol/L (ref 0–100)

## 2012-04-14 LAB — CBC WITH DIFFERENTIAL/PLATELET
Basophils Absolute: 0 10*3/uL (ref 0.0–0.1)
Basophils Relative: 0 % (ref 0–1)
Eosinophils Absolute: 0 10*3/uL (ref 0.0–0.7)
Eosinophils Relative: 0 % (ref 0–5)
HCT: 38.1 % (ref 36.0–46.0)
Hemoglobin: 12.4 g/dL (ref 12.0–15.0)
Lymphocytes Relative: 11 % — ABNORMAL LOW (ref 12–46)
Lymphs Abs: 1.2 10*3/uL (ref 0.7–4.0)
MCH: 29.3 pg (ref 26.0–34.0)
MCHC: 32.5 g/dL (ref 30.0–36.0)
MCV: 90.1 fL (ref 78.0–100.0)
Monocytes Absolute: 0.8 10*3/uL (ref 0.1–1.0)
Monocytes Relative: 8 % (ref 3–12)
Neutro Abs: 8.8 10*3/uL — ABNORMAL HIGH (ref 1.7–7.7)
Neutrophils Relative %: 80 % — ABNORMAL HIGH (ref 43–77)
Platelets: 152 10*3/uL (ref 150–400)
RBC: 4.23 MIL/uL (ref 3.87–5.11)
RDW: 14.9 % (ref 11.5–15.5)
WBC: 10.9 10*3/uL — ABNORMAL HIGH (ref 4.0–10.5)

## 2012-04-14 LAB — AMMONIA: Ammonia: 21 umol/L (ref 11–60)

## 2012-04-14 LAB — LACTIC ACID, PLASMA: Lactic Acid, Venous: 1.9 mmol/L (ref 0.5–2.2)

## 2012-04-14 LAB — PRO B NATRIURETIC PEPTIDE: Pro B Natriuretic peptide (BNP): 675.7 pg/mL — ABNORMAL HIGH (ref 0–450)

## 2012-04-14 LAB — MRSA PCR SCREENING: MRSA by PCR: NEGATIVE

## 2012-04-14 MED ORDER — ONDANSETRON HCL 4 MG PO TABS: 4.0000 mg | ORAL_TABLET | Freq: Four times a day (QID) | ORAL | Status: DC | PRN

## 2012-04-14 MED ORDER — ONDANSETRON HCL 4 MG/2ML IJ SOLN: 4.0000 mg | Freq: Four times a day (QID) | INTRAMUSCULAR | Status: DC | PRN

## 2012-04-14 MED ORDER — SODIUM CHLORIDE 0.9 % IV SOLN
INTRAVENOUS | Status: DC
  Administered 2012-04-14: 15:00:00 via INTRAVENOUS

## 2012-04-14 MED ORDER — HYDROCODONE-ACETAMINOPHEN 5-325 MG PO TABS
1.0000 | ORAL_TABLET | ORAL | Status: DC | PRN
Start: 2012-04-14 — End: 2012-04-17
  Administered 2012-04-16: 2 via ORAL
  Filled 2012-04-14: qty 2

## 2012-04-14 MED ORDER — DEXTROSE 5 % IV SOLN
1.0000 g | INTRAVENOUS | Status: DC
  Administered 2012-04-14 – 2012-04-15 (×2): 1 g via INTRAVENOUS
  Filled 2012-04-14 (×3): qty 10

## 2012-04-14 MED ORDER — GABAPENTIN 300 MG PO CAPS
600.0000 mg | ORAL_CAPSULE | Freq: Every day | ORAL | Status: DC
  Administered 2012-04-14 – 2012-04-16 (×3): 600 mg via ORAL
  Filled 2012-04-14 (×4): qty 2

## 2012-04-14 MED ORDER — GABAPENTIN 300 MG PO CAPS: 300.0000 mg | ORAL_CAPSULE | Freq: Two times a day (BID) | ORAL | Status: DC

## 2012-04-14 MED ORDER — SIMVASTATIN 10 MG PO TABS
10.0000 mg | ORAL_TABLET | Freq: Every day | ORAL | Status: DC
  Administered 2012-04-14 – 2012-04-16 (×3): 10 mg via ORAL
  Filled 2012-04-14 (×4): qty 1

## 2012-04-14 MED ORDER — DEXTROSE 5 % IV SOLN
500.0000 mg | INTRAVENOUS | Status: DC
  Administered 2012-04-14 – 2012-04-15 (×2): 500 mg via INTRAVENOUS
  Filled 2012-04-14 (×3): qty 500

## 2012-04-14 MED ORDER — DONEPEZIL HCL 5 MG PO TABS
5.0000 mg | ORAL_TABLET | Freq: Every day | ORAL | Status: DC
  Administered 2012-04-14 – 2012-04-16 (×3): 5 mg via ORAL
  Filled 2012-04-14 (×4): qty 1

## 2012-04-14 MED ORDER — RISPERIDONE 1 MG PO TABS
1.0000 mg | ORAL_TABLET | Freq: Every evening | ORAL | Status: DC | PRN
  Administered 2012-04-14 – 2012-04-16 (×3): 1 mg via ORAL
  Filled 2012-04-14 (×8): qty 1

## 2012-04-14 MED ORDER — FLUOXETINE HCL 20 MG PO CAPS
20.0000 mg | ORAL_CAPSULE | Freq: Every day | ORAL | Status: DC
  Administered 2012-04-14 – 2012-04-17 (×4): 20 mg via ORAL
  Filled 2012-04-14 (×4): qty 1

## 2012-04-14 MED ORDER — ALBUTEROL SULFATE (5 MG/ML) 0.5% IN NEBU: 2.5000 mg | INHALATION_SOLUTION | RESPIRATORY_TRACT | Status: DC | PRN

## 2012-04-14 MED ORDER — GABAPENTIN 300 MG PO CAPS
300.0000 mg | ORAL_CAPSULE | Freq: Every day | ORAL | Status: DC
  Administered 2012-04-15 – 2012-04-17 (×3): 300 mg via ORAL
  Filled 2012-04-14 (×3): qty 1

## 2012-04-14 MED ORDER — LEVOTHYROXINE SODIUM 50 MCG PO TABS
50.0000 ug | ORAL_TABLET | Freq: Every day | ORAL | Status: DC
  Administered 2012-04-14 – 2012-04-17 (×4): 50 ug via ORAL
  Filled 2012-04-14 (×4): qty 1

## 2012-04-14 NOTE — Progress Notes (Signed)
Coverage CSW contacted to ascertain possible placement of this 79 year old female into a SNF unit. CSW met with patient's son Smitty Cords. Patient has formerly been a resident of [facility]-- d/c back home in December of 2013.  Spoke to [employer], Admissions - [facility].  She will review referral but states she does not have a bed today- possibly tomorrow.  CSW assessment to follow.  Lorri Frederick. [place]  [phone]

## 2012-04-14 NOTE — ED Notes (Signed)
Pt transported to MRI 

## 2012-04-14 NOTE — ED Notes (Signed)
Pt returned from radiology.

## 2012-04-14 NOTE — ED Provider Notes (Signed)
I saw and evaluated the patient, reviewed the resident's note and I agree with the findings and plan.   .Face to face Exam:  General:  Awake HEENT:  Atraumatic Resp:  Normal effort Abd:  Nondistended Neuro: Left-sided arm drift Lymph: No adenopathy   Nelia Shi, MD 04/14/12 [phone]

## 2012-04-14 NOTE — ED Notes (Signed)
Per [facility], pt with hx of dementia, found on floor this morning by husband. Blood noted to carpet with laceration to posterior head. Pupils equal. Pt c/o left hip pain, no shortening or rotation noted. Pt does not recall falling.

## 2012-04-14 NOTE — ED Notes (Signed)
Dr. Beaton at the bedside. 

## 2012-04-14 NOTE — ED Notes (Addendum)
Attempted to get pt up to ambulate using the steady. ED tech at the bedside as well to assist. Pt became very shakey upon attempting to stand and was unable to maintain her own balance with the steady. Pt attempted a second time to stand up with the device and states, "I'm shakey". MD made aware.

## 2012-04-14 NOTE — Clinical Social Work Psychosocial (Signed)
     Clinical Social Work Department BRIEF PSYCHOSOCIAL ASSESSMENT 04/14/2012  Patient:  [name], [name]     Account Number:  [id number]     Admit date:  04/14/2012  Clinical Social Worker:  Tiburcio Pea  Date/Time:  04/14/2012 02:48 PM  Referred by:  Physician  Date Referred:  04/14/2012 Referred for  SNF Placement   Other Referral:   Interview type:  Family Other interview type:   Son  [name] [phone]    PSYCHOSOCIAL DATA Living Status:  WITH ADULT CHILDREN Admitted from facility:   Level of care:   Primary support name:  [name]  c  [phone] Primary support relationship to patient:  CHILD, ADULT Degree of support available:   Strong support    CURRENT CONCERNS Current Concerns  Post-Acute Placement   Other Concerns:   Recent History of stay at [facility] WORK ASSESSMENT / PLAN CSW covering ED was contacted to assist this patient/son with placement in a SNF. Patient has been a resident at [facility] in the recent past (discharging home around the first of Decemeber 2013).  Patient's son reports recent falls including one last night where patient hit her head. He is requesting placement back at [facility].  Spoke with Zigmund Daniel, Admissions at [facility]. She does not have a bed today but is willing to review patient for next available bed.  Fl2 initiated and sent to facility. Referral information is limited at this time due to patient being in the Emergency Room.   Assessment/plan status:  Psychosocial Support/Ongoing Assessment of Needs Other assessment/ plan:   Information/referral to community resources:   none at this time  Patient has had [facility]; services have only recently stopped.    PATIENTS/FAMILYS RESPONSE TO PLAN OF CARE: Patient is alert and is oriented to person; disoriented to place and time. She is pleasant.  Son is supportive but appears overwhelmed with caregiving responsibilities. He does not feel  he can meet her needs at home any longer. CSW will attempt placement from E.D.  Son is prepared to take her back home with [facility] if placement is unsucessful.

## 2012-04-14 NOTE — ED Notes (Signed)
Pt sleeping soundly with family at bedside. 

## 2012-04-14 NOTE — ED Notes (Signed)
Son at the bedside, states he lives on the other end of the house and found her about 0630 this morning on the floor with blood around her head.

## 2012-04-14 NOTE — Progress Notes (Signed)
ED CM spoke with Smitty Cords, son via telephone.  Son informed CM that the pt was d/c from snf in first week in December 2013 to home and he has been the primary care giver at this time.  Reports pt is not bathing independently, is not taking medications independently and is walking unsteady. Bruce reports [facility] seeing pt in the home after d/c from snf.  Bruce reports pt d/c from [place] services "last week because her time was up" Bruce voiced understanding that if there is not found reason for admission that pt can get to snf with assist of home health and pcp.  At 1300 CM spoke with Dr Shirlee Latch about call to son, Smitty Cords and need of home health orders to assist home health agency and pcp to get pt to snf form community if there is not a reason for admission Dr Shirlee Latch states there is not a reason for admission at this time.  At 1307 CM left a voice message for Marcine Matar of [facility] to provide a referral for pt Pending a return call

## 2012-04-14 NOTE — Progress Notes (Signed)
Pt admitted to 3300 from ED.  Oriented to unit, room, and call bell - placed at pt's side.  VSS.  Son at bedside.  Roselie Awkward, RN

## 2012-04-14 NOTE — Progress Notes (Signed)
ED CM consulted and provided a referral to [facility].  CM updated Dr Radford Pax and Dr Shirlee Latch who states they are requesting admission for pt.  CM updated Dianna Limbo of [facility] and will follow pt for any possible d/c needs. CM discussed need for change in rolling walker order to Dr Shirlee Latch (enter as DME) if it may be needed  CM updated Bruce and reviewed with him what will happen if pt is admitted and plans that are in place if pt has to be d/c home He voiced understanding of both options, agreed to both options, prefers [facility] if snf placement needed and chooses [facility] if d/c home Confirms again that Dr Ronne Binning is still pcp and his niece has called him to assist with home resources if needed

## 2012-04-14 NOTE — Progress Notes (Signed)
CM attempted to contact pt's pcp at [phone] but CM unable to leave a voice message (mailbox full)

## 2012-04-14 NOTE — H&P (Signed)
PCP:   Billee Cashing, MD   Chief Complaint:  Status post fall  HPI: 79 year old female with a history of dementia, MI, asthma, hypothyroidism, hypertension, gait disorder, frequent falls who was discharged from [facility] in November for rehabilitation at [facility]. [facility] was [facility] placed in December. As per family patient has continued to decline since December she has been frequently falling at home has poor by mouth intake. Patient was seen at her primary care physician's office on 04/13/2012 for possible bronchitis and was started on by mouth Zithromax. This morning patient was found on the floor with bleeding from her head. Patient was found to have laceration in the posterior scalp. Patient also was found to be picking berries at home. Patient at this time is very somnolent, unable to follow commands though able to answer a few questions. Most of history is obtained from the patient's son and her sister in law.  Allergies:   Allergies  Allergen Reactions  . Other Other (See Comments)    Reaction unknown: Mercurocrome.      Past Medical History  Diagnosis Date  . Stroke   . Arthritis   . Dementia   . Pneumonia   . Multiple falls   . Myocardial infarction   . Anginal pain     "once in awhile" (01/15/2012)  . Asthma   . Shortness of breath     "any time" (01/15/2012)  . Hypothyroidism   . Hypercholesteremia   . Hypertension   . Skin cancer     "arms, face, maybe on my right big toe" (01/15/2012)  . Dizzinesses   . On home oxygen therapy   . Depression     Past Surgical History  Procedure Date  . Tonsillectomy ~ 1946  . Cataract extraction w/ intraocular lens  implant, bilateral   . Skin cancer excision     "arms, face" (01/15/2012)  . Tumor excision     "cut off 5th vertebra" (01/15/2012)  . Tympanoplasty     right ear    Prior to Admission medications   Medication Sig Start Date End Date Taking? Authorizing Provider   acetaminophen (TYLENOL) 325 MG tablet Take 2 tablets (650 mg total) by mouth every 6 (six) hours as needed for pain (or Fever >/= 101). 01/17/12  Yes Shanker Levora Dredge, MD  albuterol (PROVENTIL) (2.5 MG/3ML) 0.083% nebulizer solution Take 2.5 mg by nebulization every 4 (four) hours as needed. For shortness of breath   Yes Historical Provider, MD  allopurinol (ZYLOPRIM) 100 MG tablet Take 100 mg by mouth daily.   Yes Historical Provider, MD  aspirin 325 MG tablet Take 325 mg by mouth daily.   Yes Historical Provider, MD  azithromycin (ZITHROMAX) 250 MG tablet Take 250-500 mg by mouth daily. Starting 04/13/12 take 500 mg day 1, then 250 mg days days 2-5   Yes Historical Provider, MD  Cholecalciferol (VITAMIN D PO) Take 1 tablet by mouth daily.   Yes Historical Provider, MD  diazepam (VALIUM) 5 MG tablet Take 1 tablet (5 mg total) by mouth every 12 (twelve) hours as needed. For anxiety 01/17/12  Yes Shanker Levora Dredge, MD  donepezil (ARICEPT) 5 MG tablet Take 5 mg by mouth at bedtime.   Yes Historical Provider, MD  FLUoxetine (PROZAC) 20 MG capsule Take 20 mg by mouth daily.   Yes Historical Provider, MD  fluticasone (FLONASE) 50 MCG/ACT nasal spray Place 2 sprays into the nose daily.   Yes Historical Provider, MD  furosemide (LASIX) 40 MG tablet Take 40 mg by mouth daily.   Yes Historical Provider, MD  gabapentin (NEURONTIN) 300 MG capsule Take 300-600 mg by mouth 2 (two) times daily. 1 cap in AM and 2 in PM   Yes Historical Provider, MD  HYDROcodone-acetaminophen (NORCO) 7.5-325 MG per tablet Take 1 tablet by mouth every 4 (four) hours as needed. For pain   Yes Historical Provider, MD  levothyroxine (SYNTHROID, LEVOTHROID) 50 MCG tablet Take 50 mcg by mouth daily.   Yes Historical Provider, MD  metoprolol tartrate (LOPRESSOR) 25 MG tablet Take 25 mg by mouth daily.   Yes Historical Provider, MD  risperiDONE (RISPERDAL) 1 MG tablet Take 1 mg by mouth at bedtime and may repeat dose one time if needed.   Yes  Historical Provider, MD  simvastatin (ZOCOR) 10 MG tablet Take 10 mg by mouth at bedtime.   Yes Historical Provider, MD  tiotropium (SPIRIVA) 18 MCG inhalation capsule Place 18 mcg into inhaler and inhale daily.   Yes Historical Provider, MD    Social History:  reports that she has quit smoking. Her smoking use included Cigarettes. She has a 55 pack-year smoking history. She has never used smokeless tobacco. She reports that she drinks alcohol. Her drug history not on file.  No family history on file.  Review of Systems:  HEENT: Denies headache, blurred vision, runny nose, sore throat,  Neck: Positive hypothyroidism Chest : Cough with greenish yellow phlegm  Heart : Denies Chest pain GI: Denies  nausea, vomiting, diarrhea, constipation GU: Denies dysuria, urgency, frequency of urination, hematuria Neuro: Positive history of stroke, dementia    Physical Exam: Blood pressure 95/31, pulse 74, temperature 99.8 F (37.7 C), temperature source Rectal, resp. rate 25, SpO2 93.00%. Constitutional:   Patient is somnolent, unable to follow commands Head: Posterior scalp laceration repaired Mouth: Mucus membranes dry Neck: Supple, No Thyromegaly Cardiovascular: RRR, S1 normal, S2 normal Pulmonary/Chest: Scattered rhonchi bilaterally Abdominal: Soft. Non-tender, non-distended, bowel sounds are normal, no masses, organomegaly, or guarding present.  Neurological: patient is somnolent, full exam couldn't be done as patient is not cooperative at this time Extremities : No Cyanosis, Clubbing or Edema   Labs on Admission:  Results for orders placed during the hospital encounter of 04/14/12 (from the past 48 hour(s))  POCT I-STAT, CHEM 8     Status: Abnormal   Collection Time   04/14/12  8:27 AM      Component Value Range Comment   Sodium 141  135 - 145 mEq/L    Potassium 4.0  3.5 - 5.1 mEq/L    Chloride 105  96 - 112 mEq/L    BUN 18  6 - 23 mg/dL    Creatinine, Ser 1.61  0.50 - 1.10 mg/dL     Glucose, Bld 096 (*) 70 - 99 mg/dL    Calcium, Ion 0.45 (*) 1.13 - 1.30 mmol/L    TCO2 27  0 - 100 mmol/L    Hemoglobin 13.3  12.0 - 15.0 g/dL    HCT 40.9  81.1 - 91.4 %   URINALYSIS, ROUTINE W REFLEX MICROSCOPIC     Status: Normal   Collection Time   04/14/12  9:42 AM      Component Value Range Comment   Color, Urine YELLOW  YELLOW    APPearance CLEAR  CLEAR    Specific Gravity, Urine 1.015  1.005 - 1.030    pH 5.0  5.0 - 8.0    Glucose, UA NEGATIVE  NEGATIVE mg/dL    Hgb urine dipstick NEGATIVE  NEGATIVE    Bilirubin Urine NEGATIVE  NEGATIVE    Ketones, ur NEGATIVE  NEGATIVE mg/dL    Protein, ur NEGATIVE  NEGATIVE mg/dL    Urobilinogen, UA 0.2  0.0 - 1.0 mg/dL    Nitrite NEGATIVE  NEGATIVE    Leukocytes, UA NEGATIVE  NEGATIVE MICROSCOPIC NOT DONE ON URINES WITH NEGATIVE PROTEIN, BLOOD, LEUKOCYTES, NITRITE, OR GLUCOSE <1000 mg/dL.  LACTIC ACID, PLASMA     Status: Normal   Collection Time   04/14/12 11:06 AM      Component Value Range Comment   Lactic Acid, Venous 1.9  0.5 - 2.2 mmol/L   HEPATIC FUNCTION PANEL     Status: Abnormal   Collection Time   04/14/12 11:30 AM      Component Value Range Comment   Total Protein 6.8  6.0 - 8.3 g/dL    Albumin 3.6  3.5 - 5.2 g/dL    AST 31  0 - 37 U/L    ALT 17  0 - 35 U/L    Alkaline Phosphatase 94  39 - 117 U/L    Total Bilirubin 0.2 (*) 0.3 - 1.2 mg/dL    Bilirubin, Direct <1.6  0.0 - 0.3 mg/dL    Indirect Bilirubin NOT CALCULATED  0.3 - 0.9 mg/dL   CBC WITH DIFFERENTIAL     Status: Abnormal   Collection Time   04/14/12 11:43 AM      Component Value Range Comment   WBC 10.9 (*) 4.0 - 10.5 K/uL    RBC 4.23  3.87 - 5.11 MIL/uL    Hemoglobin 12.4  12.0 - 15.0 g/dL    HCT 10.9  60.4 - 54.0 %    MCV 90.1  78.0 - 100.0 fL    MCH 29.3  26.0 - 34.0 pg    MCHC 32.5  30.0 - 36.0 g/dL    RDW 98.1  19.1 - 47.8 %    Platelets 152  150 - 400 K/uL    Neutrophils Relative 80 (*) 43 - 77 %    Neutro Abs 8.8 (*) 1.7 - 7.7 K/uL    Lymphocytes  Relative 11 (*) 12 - 46 %    Lymphs Abs 1.2  0.7 - 4.0 K/uL    Monocytes Relative 8  3 - 12 %    Monocytes Absolute 0.8  0.1 - 1.0 K/uL    Eosinophils Relative 0  0 - 5 %    Eosinophils Absolute 0.0  0.0 - 0.7 K/uL    Basophils Relative 0  0 - 1 %    Basophils Absolute 0.0  0.0 - 0.1 K/uL   AMMONIA     Status: Normal   Collection Time   04/14/12 11:43 AM      Component Value Range Comment   Ammonia 21  11 - 60 umol/L     Radiological Exams on Admission: Dg Pelvis 1-2 Views  04/14/2012  *RADIOLOGY REPORT*  Clinical Data: Status post fall.  Pain.  PELVIS - 1-2 VIEW  Comparison: Single view of the pelvis 01/15/2012.  Findings: The hips are located.  No fracture is identified.  Mild appearing bilateral hip degenerative change is noted.  Convex right lumbar scoliosis and degenerative disease are partially visualized.  IMPRESSION: No acute finding.   Original Report Authenticated By: Holley Dexter, M.D.    Ct Head Wo Contrast  04/14/2012  *RADIOLOGY REPORT*  Clinical Data:  Status post fall.  CT  HEAD WITHOUT CONTRAST CT CERVICAL SPINE WITHOUT CONTRAST  Technique:  Multidetector CT imaging of the head and cervical spine was performed following the standard protocol without intravenous contrast.  Multiplanar CT image reconstructions of the cervical spine were also generated.  Comparison:  Head and cervical spine CT scan 09/06/2010 and head CT scan 01/15/2012.  CT HEAD  Findings: Cortical atrophy and chronic microvascular ischemic change are again seen.  No evidence of acute abnormality including infarct ridge, mass lesion, mass effect, midline shift or abnormal extra-axial fluid collection is identified.  There is mild mucosal thickening in the maxillary sinuses, greater on the right. Scattered ethmoid air cell disease is also noted.  Atherosclerosis is seen.  Calvarium intact.  IMPRESSION: No acute finding.  CT CERVICAL SPINE  Findings: There is no fracture.  Facet mediated anterolisthesis of C3 on C4  and C4 on C5 is unchanged.  C6-7 fusion is again identified. Marked multilevel loss of disc space height appears unchanged.  Paraspinous structures are unremarkable.  Lung apices are clear.  IMPRESSION: No acute finding.  Marked multilevel degenerative change.   Original Report Authenticated By: Holley Dexter, M.D.    Ct Cervical Spine Wo Contrast  04/14/2012  *RADIOLOGY REPORT*  Clinical Data:  Status post fall.  CT HEAD WITHOUT CONTRAST CT CERVICAL SPINE WITHOUT CONTRAST  Technique:  Multidetector CT imaging of the head and cervical spine was performed following the standard protocol without intravenous contrast.  Multiplanar CT image reconstructions of the cervical spine were also generated.  Comparison:  Head and cervical spine CT scan 09/06/2010 and head CT scan 01/15/2012.  CT HEAD  Findings: Cortical atrophy and chronic microvascular ischemic change are again seen.  No evidence of acute abnormality including infarct ridge, mass lesion, mass effect, midline shift or abnormal extra-axial fluid collection is identified.  There is mild mucosal thickening in the maxillary sinuses, greater on the right. Scattered ethmoid air cell disease is also noted.  Atherosclerosis is seen.  Calvarium intact.  IMPRESSION: No acute finding.  CT CERVICAL SPINE  Findings: There is no fracture.  Facet mediated anterolisthesis of C3 on C4 and C4 on C5 is unchanged.  C6-7 fusion is again identified. Marked multilevel loss of disc space height appears unchanged.  Paraspinous structures are unremarkable.  Lung apices are clear.  IMPRESSION: No acute finding.  Marked multilevel degenerative change.   Original Report Authenticated By: Holley Dexter, M.D.     Assessment/Plan Altered mental status Frequent falls Acute bronchitis Failure to thrive Dementia  Altered mental status CT of the head is negative.   will order MRI brain to rule out stroke.  Will also order a chest x-ray to rule out underlying pneumonia   will  start the patient empirically on Rocephin and Zithromax Blood cultures x2 have been ordered  Frequent falls Patient has been having frequent falls over the past 3 months Family is unable to take care of patient We'll order PT OT consult Patient may need to go to skilled nursing facility  Acute bronchitis  Patient was seen in the PCP office and was started on Zithromax for possible acute bronchitis  Will obtain chest x-ray today and continued on Zithromax and Rocephin at this time   Dementia  Patient will continue her Aricept   History of Hypertension At this time patient's blood pressure is low so hold the antihypertensive medications. Patient will be started on IV fluids normal saline at 100 mL per hour   DVT prophylaxis: SCDs  CODE STATUS: Patient  is DO NOT RESUSCITATE Family communication: Patient's son and sister-in-law at bedside discussed in detail. If patient does not improve with above measures a palliative care consult may be needed  Disposition: Pending PT evaluation  Niti Leisure S [facility] Pager: [phone] 04/14/2012, 3:00 PM

## 2012-04-14 NOTE — ED Notes (Signed)
Dr. Shirlee Latch back at the bedside with this RN to remove c-collar. Pt is more lethargic at this time and only responds to painful stimuli and localizes pain.

## 2012-04-14 NOTE — ED Provider Notes (Signed)
History     CSN: [id number]  Arrival date & time 04/14/12  0724   First MD Initiated Contact with Patient 04/14/12 [phone]      Chief Complaint  Patient presents with  . Fall    (Consider location/radiation/quality/duration/timing/severity/associated sxs/prior treatment) HPI Comments: 79 y.o old PMH dementia and multiple medical problems presents status post unwitnessed fall this morning.  She states she was picking berries, fell and could not get back up.  She possibly hit her head on the fireplace piece.  Signigicant blood on the floor at home and laceration in posterior scalp.  Cervical collar intact per EMS She lives a home with her son who found her this morning down.  EMS came to their home and the patient complained of her neck hurting, lower back pain (this is chronic as well), and left hip pain.  She has had multiple falls in 01/2012 and went to [facility] until 02/2012 and has lived with her son since then but he would like her to return to [facility] on [address].    PCP: Dr. [facility]   The history is provided by the patient and a relative. No language interpreter was used.    Past Medical History  Diagnosis Date  . Stroke   . Arthritis   . Dementia   . Pneumonia   . Multiple falls   . Myocardial infarction   . Anginal pain     "once in awhile" (01/15/2012)  . Asthma   . Shortness of breath     "any time" (01/15/2012)  . Hypothyroidism   . Hypercholesteremia   . Hypertension   . Skin cancer     "arms, face, maybe on my right big toe" (01/15/2012)  . Dizzinesses   . On home oxygen therapy   . Depression     Past Surgical History  Procedure Date  . Tonsillectomy ~ 1946  . Cataract extraction w/ intraocular lens  implant, bilateral   . Skin cancer excision     "arms, face" (01/15/2012)  . Tumor excision     "cut off 5th vertebra" (01/15/2012)  . Tympanoplasty     right ear    No family history on file.  History  Substance Use  Topics  . Smoking status: Former Smoker -- 1.0 packs/day for 55 years    Types: Cigarettes  . Smokeless tobacco: Never Used     Comment: 01/15/2012 "stopped smoking ~ 12 yr ago"  . Alcohol Use: Yes     Comment: 01/15/2012 "stopped all alcohol 30 some years ago"    OB History    Grav Para Term Preterm Abortions TAB SAB Ect Mult Living                  Review of Systems  HENT: Positive for neck pain.   Eyes: Negative for photophobia.  Respiratory: Positive for cough.        Cough with yellow sputum given AZM by PCP 2/4  Cardiovascular: Negative for chest pain.  Gastrointestinal: Positive for abdominal pain and constipation.       Mild LLQ pain   Genitourinary: Negative for dysuria.  Musculoskeletal: Positive for back pain.       Chronic low back pain  Neurological: Positive for dizziness and headaches.       Mild h/a post fall Dizziness with movement by RN staff in bed, resolved   Psychiatric/Behavioral:       +dementia     Allergies  Other  Home Medications   Current Outpatient Rx  Name  Route  Sig  Dispense  Refill  . ACETAMINOPHEN 325 MG PO TABS   Oral   Take 2 tablets (650 mg total) by mouth every 6 (six) hours as needed for pain (or Fever >/= 101).         . ALBUTEROL SULFATE (2.5 MG/3ML) 0.083% IN NEBU   Nebulization   Take 2.5 mg by nebulization every 4 (four) hours as needed. For shortness of breath         . ALLOPURINOL 100 MG PO TABS   Oral   Take 100 mg by mouth daily.         . ASPIRIN 325 MG PO TABS   Oral   Take 325 mg by mouth daily.         . AZITHROMYCIN 250 MG PO TABS   Oral   Take 250-500 mg by mouth daily. Starting 04/13/12 take 500 mg day 1, then 250 mg days days 2-5         . VITAMIN D PO   Oral   Take 1 tablet by mouth daily.         Marland Kitchen DIAZEPAM 5 MG PO TABS   Oral   Take 1 tablet (5 mg total) by mouth every 12 (twelve) hours as needed. For anxiety   30 tablet   0   . DONEPEZIL HCL 5 MG PO TABS   Oral   Take 5 mg by  mouth at bedtime.         . FLUOXETINE HCL 20 MG PO CAPS   Oral   Take 20 mg by mouth daily.         Marland Kitchen FLUTICASONE PROPIONATE 50 MCG/ACT NA SUSP   Nasal   Place 2 sprays into the nose daily.         . FUROSEMIDE 40 MG PO TABS   Oral   Take 40 mg by mouth daily.         Marland Kitchen GABAPENTIN 300 MG PO CAPS   Oral   Take 300-600 mg by mouth 2 (two) times daily. 1 cap in AM and 2 in PM         . HYDROCODONE-ACETAMINOPHEN 7.5-325 MG PO TABS   Oral   Take 1 tablet by mouth every 4 (four) hours as needed. For pain         . LEVOTHYROXINE SODIUM 50 MCG PO TABS   Oral   Take 50 mcg by mouth daily.         Marland Kitchen METOPROLOL TARTRATE 25 MG PO TABS   Oral   Take 25 mg by mouth daily.         Marland Kitchen RISPERIDONE 1 MG PO TABS   Oral   Take 1 mg by mouth at bedtime and may repeat dose one time if needed.         Marland Kitchen SIMVASTATIN 10 MG PO TABS   Oral   Take 10 mg by mouth at bedtime.         Marland Kitchen TIOTROPIUM BROMIDE MONOHYDRATE 18 MCG IN CAPS   Inhalation   Place 18 mcg into inhaler and inhale daily.           BP 95/31  Pulse 74  Temp 99.8 F (37.7 C) (Rectal)  Resp 25  SpO2 93%  Physical Exam  Nursing note and vitals reviewed. Constitutional: She is oriented to person, place, and time. Vital signs are normal. She appears well-developed  and well-nourished. She is cooperative. No distress.  HENT:  Head: Normocephalic. Head is with laceration.    Mouth/Throat: Oropharynx is clear and moist and mucous membranes are normal. She has dentures. No oropharyngeal exudate.  Eyes: Conjunctivae normal are normal. Pupils are equal, round, and reactive to light. Right eye exhibits no discharge. Left eye exhibits discharge. No scleral icterus.       Pupils constricted but reactive   Neck:       c collar intact  Cardiovascular: Normal rate, regular rhythm, S1 normal, S2 normal and normal heart sounds.   No murmur heard. Pulmonary/Chest: Effort normal and breath sounds normal. No  respiratory distress. She has no wheezes.  Abdominal: Soft. Bowel sounds are normal. There is tenderness.       Mild ttp LLQ  Musculoskeletal: She exhibits no tenderness.       Right hip: She exhibits no tenderness.       Left hip: She exhibits no tenderness.       No ttp cervical, thoracic, lumbar spine Moving lower extremities without pain in b/l hips  Neg ttp b/l hips  Neurological: She is alert and oriented to person, place, and time.       Lower ext strength 3+/5   Skin: Skin is warm, dry and intact. No rash noted. She is not diaphoretic.  Psychiatric:       Dementia. Not oriented to place, year, month.  Oriented to sons name and her name    ED Course  LACERATION REPAIR Date/Time: 04/14/2012 1:02 PM Performed by: Annett Gula Authorized by: Nelia Shi Consent: Verbal consent obtained. Risks and benefits: risks, benefits and alternatives were discussed Consent given by: power of attorney Patient understanding: patient states understanding of the procedure being performed Patient consent: the patient's understanding of the procedure matches consent given Procedure consent: procedure consent matches procedure scheduled Relevant documents: relevant documents present and verified Test results: test results available and properly labeled Site marked: the operative site was marked Imaging studies: imaging studies available Patient identity confirmed: verbally with patient Time out: Immediately prior to procedure a "time out" was called to verify the correct patient, procedure, equipment, support staff and site/side marked as required. Body area: head/neck Location details: scalp Laceration length: 1 cm Foreign bodies: no foreign bodies Tendon involvement: none Nerve involvement: none Vascular damage: no Local anesthetic: lidocaine 2% without epinephrine Anesthetic total: 5 ml Patient sedated: no Preparation: Patient was prepped and draped in the usual sterile  fashion. Irrigation solution: saline Irrigation method: syringe Amount of cleaning: standard Debridement: none Degree of undermining: none Skin closure: staples Patient tolerance: Patient tolerated the procedure well with no immediate complications. Comments: 4 staples    (including critical care time)  Labs Reviewed  POCT I-STAT, CHEM 8 - Abnormal; Notable for the following:    Glucose, Bld 120 (*)     Calcium, Ion 1.37 (*)     All other components within normal limits  CBC WITH DIFFERENTIAL - Abnormal; Notable for the following:    WBC 10.9 (*)     Neutrophils Relative 80 (*)     Neutro Abs 8.8 (*)     Lymphocytes Relative 11 (*)     All other components within normal limits  HEPATIC FUNCTION PANEL - Abnormal; Notable for the following:    Total Bilirubin 0.2 (*)     All other components within normal limits  URINALYSIS, ROUTINE W REFLEX MICROSCOPIC  LACTIC ACID, PLASMA  AMMONIA  CULTURE, BLOOD (  ROUTINE X 2)  CULTURE, BLOOD (ROUTINE X 2)   Dg Pelvis 1-2 Views  04/14/2012  *RADIOLOGY REPORT*  Clinical Data: Status post fall.  Pain.  PELVIS - 1-2 VIEW  Comparison: Single view of the pelvis 01/15/2012.  Findings: The hips are located.  No fracture is identified.  Mild appearing bilateral hip degenerative change is noted.  Convex right lumbar scoliosis and degenerative disease are partially visualized.  IMPRESSION: No acute finding.   Original Report Authenticated By: Holley Dexter, M.D.    Ct Head Wo Contrast  04/14/2012  *RADIOLOGY REPORT*  Clinical Data:  Status post fall.  CT HEAD WITHOUT CONTRAST CT CERVICAL SPINE WITHOUT CONTRAST  Technique:  Multidetector CT imaging of the head and cervical spine was performed following the standard protocol without intravenous contrast.  Multiplanar CT image reconstructions of the cervical spine were also generated.  Comparison:  Head and cervical spine CT scan 09/06/2010 and head CT scan 01/15/2012.  CT HEAD  Findings: Cortical atrophy and  chronic microvascular ischemic change are again seen.  No evidence of acute abnormality including infarct ridge, mass lesion, mass effect, midline shift or abnormal extra-axial fluid collection is identified.  There is mild mucosal thickening in the maxillary sinuses, greater on the right. Scattered ethmoid air cell disease is also noted.  Atherosclerosis is seen.  Calvarium intact.  IMPRESSION: No acute finding.  CT CERVICAL SPINE  Findings: There is no fracture.  Facet mediated anterolisthesis of C3 on C4 and C4 on C5 is unchanged.  C6-7 fusion is again identified. Marked multilevel loss of disc space height appears unchanged.  Paraspinous structures are unremarkable.  Lung apices are clear.  IMPRESSION: No acute finding.  Marked multilevel degenerative change.   Original Report Authenticated By: Holley Dexter, M.D.    Ct Cervical Spine Wo Contrast  04/14/2012  *RADIOLOGY REPORT*  Clinical Data:  Status post fall.  CT HEAD WITHOUT CONTRAST CT CERVICAL SPINE WITHOUT CONTRAST  Technique:  Multidetector CT imaging of the head and cervical spine was performed following the standard protocol without intravenous contrast.  Multiplanar CT image reconstructions of the cervical spine were also generated.  Comparison:  Head and cervical spine CT scan 09/06/2010 and head CT scan 01/15/2012.  CT HEAD  Findings: Cortical atrophy and chronic microvascular ischemic change are again seen.  No evidence of acute abnormality including infarct ridge, mass lesion, mass effect, midline shift or abnormal extra-axial fluid collection is identified.  There is mild mucosal thickening in the maxillary sinuses, greater on the right. Scattered ethmoid air cell disease is also noted.  Atherosclerosis is seen.  Calvarium intact.  IMPRESSION: No acute finding.  CT CERVICAL SPINE  Findings: There is no fracture.  Facet mediated anterolisthesis of C3 on C4 and C4 on C5 is unchanged.  C6-7 fusion is again identified. Marked multilevel loss of  disc space height appears unchanged.  Paraspinous structures are unremarkable.  Lung apices are clear.  IMPRESSION: No acute finding.  Marked multilevel degenerative change.   Original Report Authenticated By: Holley Dexter, M.D.      1. Fall   2. Acute encephalopathy   3. Laceration of scalp-left occipital        MDM  1. Status post fall -left >right weakness concern for safety, possible stroke   -CT head, Cspine, pelvis, istat -1.0 cm left scalp laceration repair.   2. Acute encephalopathy-improving -negative UA, pending blood cultures -mild leukocytosis on cbc, wnl lfts, wnl lactic acid, wnl ammonia  -CT head and C  spine no acute abnormality  3. Dispo -admit for further w/u to [facility] (Dr. Sharl Ma) then transition back to [facility] -will need social worker consult   Shirlee Latch MD [phone]      Annett Gula, MD 04/14/12 1402  Annett Gula, MD 04/14/12 1404  Annett Gula, MD 04/14/12 1414  Annett Gula, MD 04/14/12 [phone]

## 2012-04-14 NOTE — ED Notes (Signed)
Patient blood pressure has decreased.  rn-haley notified.

## 2012-04-14 NOTE — Progress Notes (Signed)
At 1321 received a voice message from [facility] who reports services were discontinued for this pt as of  04/09/12.  Genevieve Norlander received pt's referral from [facility] in December 2013.  She reports pt has "maxed out" and will not qualify for home health services any longer. Declined patient for further home services. At 1359 ED CM updated Dr Radford Pax

## 2012-04-14 NOTE — ED Notes (Signed)
Phlebotomy back at the bedside.  

## 2012-04-14 NOTE — ED Notes (Signed)
Dr. Shirlee Latch at the bedside.

## 2012-04-15 DIAGNOSIS — I359 Nonrheumatic aortic valve disorder, unspecified: Secondary | ICD-10-CM

## 2012-04-15 LAB — COMPREHENSIVE METABOLIC PANEL
Alkaline Phosphatase: 86 U/L (ref 39–117)
BUN: 11 mg/dL (ref 6–23)
Creatinine, Ser: 0.69 mg/dL (ref 0.50–1.10)
GFR calc Af Amer: >90 mL/min (ref >90)
Glucose, Bld: 95 mg/dL (ref 70–99)
Potassium: 3.1 mEq/L — ABNORMAL LOW (ref 3.5–5.1)
Total Bilirubin: 0.2 mg/dL — ABNORMAL LOW (ref 0.3–1.2)
Total Protein: 6.2 g/dL (ref 6.0–8.3)

## 2012-04-15 LAB — CBC
HCT: 33.3 % — ABNORMAL LOW (ref 36.0–46.0)
Hemoglobin: 10.7 g/dL — ABNORMAL LOW (ref 12.0–15.0)
MCHC: 32.1 g/dL (ref 30.0–36.0)
MCV: 90 fL (ref 78.0–100.0)

## 2012-04-15 MED ORDER — POTASSIUM CHLORIDE CRYS ER 20 MEQ PO TBCR
40.0000 meq | EXTENDED_RELEASE_TABLET | Freq: Once | ORAL | Status: AC
  Administered 2012-04-15: 40 meq via ORAL
  Filled 2012-04-15: qty 2

## 2012-04-15 NOTE — Progress Notes (Signed)
CSW received call from [name], Admissions- [facility]. Bed offer in place for patient who is currently on unit 3300.  PT evaluation and H & P sent to facility via [facility].  Will ask unit CSW- Theresia Bough to follow up with bed offer pending medical stability. This is bed of choice per son.  This CSW will sign off.  Lorri Frederick. [place]  [phone]

## 2012-04-15 NOTE — Progress Notes (Signed)
Pt has transferred to 3W. CSW has informed new unit CSW who will facilitate with discharge plans. This CSW signing off.  Theresia Bough, MSW, Theresia Majors [phone]

## 2012-04-15 NOTE — Progress Notes (Signed)
Report called to [name], Consulting civil engineer on 3W.  Pt transferred to 3W4 via bed with all belongings. Son accompanied pt to new room.   Roselie Awkward, RN

## 2012-04-15 NOTE — Progress Notes (Signed)
Clinical Child psychotherapist (CSW) visited pt room and met with pt sons to inform them that [facility] is able to offer a semi-private room for pt when medically ready. Pt sons very appreciative and agreeable to the semi-private. Sons aware of pt transferring to another floor and follow up to be received from new unit CSW. Son's had no concerns, pt alert and also very pleasant. CSW will inform new unit CSW of plans for discharge to [facility] place when medically ready.  Theresia Bough, MSW, Theresia Majors [phone]

## 2012-04-15 NOTE — Clinical Social Work Placement (Addendum)
    Clinical Social Work Department CLINICAL SOCIAL WORK PLACEMENT NOTE 04/15/2012  Patient:  [name], [name]  Account Number:  [id number] Admit date:  04/14/2012  Clinical Social Worker:  Lupita Leash Jeray Shugart, LCSWA  Date/time:  04/14/2012 03:09 PM  Clinical Social Work is seeking post-discharge placement for this patient at the following level of care:   SKILLED NURSING   (*CSW will update this form in Epic as items are completed)     Patient/family provided with [facility] Department of Clinical Social Work's list of facilities offering this level of care within the geographic area requested by the patient (or if unable, by the patient's family).    Patient/family informed of their freedom to choose among providers that offer the needed level of care, that participate in Medicare, Medicaid or managed care program needed by the patient, have an available bed and are willing to accept the patient.    Patient/family informed of [facility]' ownership interest in [facility], as well as of the fact that they are under no obligation to receive care at this facility.  PASARR submitted to EDS on  PASARR number received from EDS on   FL2 transmitted to all facilities in geographic area requested by pt/family on  04/14/2012 FL2 transmitted to all facilities within larger geographic area on   Patient informed that his/her managed care company has contracts with or will negotiate with  certain facilities, including the following:   Patient has existing PASARR     Patient/family informed of bed offers received:   Patient chooses bed at  Physician recommends and patient chooses bed at    Patient to be transferred to [facility] on   Patient to be transferred to facility by Ambulance  Sharin Mons)  The following physician request were entered in Epic:   Additional Comments: 04/15/12  Patient was admitted to unit 3300 from the E.D. Theresia Bough, LCSWA is aware of patient.  Bed offer  recieved from Tammy at [facility] which is bed of choice per patient's son. Unit CSW will follow for medical stability.

## 2012-04-15 NOTE — Progress Notes (Signed)
  Echocardiogram 2D Echocardiogram has been performed.  Ellender Hose A 04/15/2012, 10:58 AM

## 2012-04-15 NOTE — Progress Notes (Signed)
[facility] PROGRESS NOTE  [name] ZOX:[id number] DOB: [birth year] DOA: 04/14/2012 PCP: Billee Cashing, MD  Assessment/Plan:  1-Altered mental status  CT of the head is negative.  MRI brain negative for  stroke.   2-Pna: Chest x ray show bilateral atelectasis vs infiltrates  will continue  Rocephin and Zithromax day 2.  Blood cultures x2 have been ordered   3-Frequent falls  Patient has been having frequent falls over the past 3 months  Family is unable to take care of patient  SNF when stable.   4-Dementia  Patient will continue her Aricept  5-History of Hypertension  BP was low on admission. Received IV fluids. BP increasing. Monitor.  6-Diarrhea: Will check C diff.   Code Status: DNR.  Family Communication: Son at bedside plan of care discussed with him.  Disposition Plan: SNF when stable.    Consultants:  none  Procedures:  None.  Antibiotics:  Ceftriaxone 2-04  Azithro 2-4  HPI/Subjective: Awake, denies dyspnea. Doesn't recall how she fell.  Relates mild cough.   Objective: Filed Vitals:   04/14/12 2017 04/15/12 0000 04/15/12 0413 04/15/12 0800  BP: 152/45 143/33 148/49 144/67  Pulse: 71 56 61 69  Temp: 98.3 F (36.8 C) 97.8 F (36.6 C) 98 F (36.7 C) 98.3 F (36.8 C)  TempSrc: Oral Oral Oral Axillary  Resp: 26 27 28 10   Height:      Weight:      SpO2: 95% 93% 93% 99%    Intake/Output Summary (Last 24 hours) at 04/15/12 1050 Last data filed at 04/15/12 0800  Gross per 24 hour  Intake 1736.67 ml  Output    800 ml  Net 936.67 ml   Filed Weights   04/14/12 1700  Weight: 62 kg (136 lb 11 oz)    Exam:   General:  No distress, awake, following   Cardiovascular: S 1, S 2 RRR  Respiratory: Crackles bases  Abdomen: Bs present, soft, NT  Data Reviewed: Basic Metabolic Panel:  Lab 04/15/12 8119 04/14/12 0827  NA 143 141  K 3.1* 4.0  CL 107 105  CO2 25 --  GLUCOSE 95 120*  BUN 11 18  CREATININE 0.69 1.10   CALCIUM 9.9 --  MG -- --  PHOS -- --   Liver Function Tests:  Lab 04/15/12 0400 04/14/12 1130  AST 53* 31  ALT 28 17  ALKPHOS 86 94  BILITOT 0.2* 0.2*  PROT 6.2 6.8  ALBUMIN 3.3* 3.6   No results found for this basename: LIPASE:5,AMYLASE:5 in the last 168 hours  Lab 04/14/12 1143  AMMONIA 21   CBC:  Lab 04/15/12 0400 04/14/12 1143 04/14/12 0827  WBC 5.1 10.9* --  NEUTROABS -- 8.8* --  HGB 10.7* 12.4 13.3  HCT 33.3* 38.1 39.0  MCV 90.0 90.1 --  PLT 129* 152 --   Cardiac Enzymes: No results found for this basename: CKTOTAL:5,CKMB:5,CKMBINDEX:5,TROPONINI:5 in the last 168 hours BNP (last 3 results)  Basename 04/14/12 1501  PROBNP 675.7*   CBG: No results found for this basename: GLUCAP:5 in the last 168 hours  Recent Results (from the past 240 hour(s))  CULTURE, BLOOD (ROUTINE X 2)     Status: Normal (Preliminary result)   Collection Time   04/14/12 11:15 AM      Component Value Range Status Comment   Specimen Description BLOOD RIGHT ANTECUBITAL   Final    Special Requests BOTTLES DRAWN AEROBIC ONLY 2CC   Final    Culture  Setup Time  04/14/2012 15:51   Final    Culture     Final    Value:        BLOOD CULTURE RECEIVED NO GROWTH TO DATE CULTURE WILL BE HELD FOR 5 DAYS BEFORE ISSUING A FINAL NEGATIVE REPORT   Report Status PENDING   Incomplete   CULTURE, BLOOD (ROUTINE X 2)     Status: Normal (Preliminary result)   Collection Time   04/14/12 11:20 AM      Component Value Range Status Comment   Specimen Description BLOOD HAND LEFT   Final    Special Requests BOTTLES DRAWN AEROBIC ONLY 2CC   Final    Culture  Setup Time 04/14/2012 15:49   Final    Culture     Final    Value:        BLOOD CULTURE RECEIVED NO GROWTH TO DATE CULTURE WILL BE HELD FOR 5 DAYS BEFORE ISSUING A FINAL NEGATIVE REPORT   Report Status PENDING   Incomplete   MRSA PCR SCREENING     Status: Normal   Collection Time   04/14/12  5:34 PM      Component Value Range Status Comment   MRSA by PCR  NEGATIVE  NEGATIVE Final      Studies: Dg Pelvis 1-2 Views  04/14/2012  *RADIOLOGY REPORT*  Clinical Data: Status post fall.  Pain.  PELVIS - 1-2 VIEW  Comparison: Single view of the pelvis 01/15/2012.  Findings: The hips are located.  No fracture is identified.  Mild appearing bilateral hip degenerative change is noted.  Convex right lumbar scoliosis and degenerative disease are partially visualized.  IMPRESSION: No acute finding.   Original Report Authenticated By: Holley Dexter, M.D.    Ct Head Wo Contrast  04/14/2012  *RADIOLOGY REPORT*  Clinical Data:  Status post fall.  CT HEAD WITHOUT CONTRAST CT CERVICAL SPINE WITHOUT CONTRAST  Technique:  Multidetector CT imaging of the head and cervical spine was performed following the standard protocol without intravenous contrast.  Multiplanar CT image reconstructions of the cervical spine were also generated.  Comparison:  Head and cervical spine CT scan 09/06/2010 and head CT scan 01/15/2012.  CT HEAD  Findings: Cortical atrophy and chronic microvascular ischemic change are again seen.  No evidence of acute abnormality including infarct ridge, mass lesion, mass effect, midline shift or abnormal extra-axial fluid collection is identified.  There is mild mucosal thickening in the maxillary sinuses, greater on the right. Scattered ethmoid air cell disease is also noted.  Atherosclerosis is seen.  Calvarium intact.  IMPRESSION: No acute finding.  CT CERVICAL SPINE  Findings: There is no fracture.  Facet mediated anterolisthesis of C3 on C4 and C4 on C5 is unchanged.  C6-7 fusion is again identified. Marked multilevel loss of disc space height appears unchanged.  Paraspinous structures are unremarkable.  Lung apices are clear.  IMPRESSION: No acute finding.  Marked multilevel degenerative change.   Original Report Authenticated By: Holley Dexter, M.D.    Ct Cervical Spine Wo Contrast  04/14/2012  *RADIOLOGY REPORT*  Clinical Data:  Status post fall.  CT HEAD  WITHOUT CONTRAST CT CERVICAL SPINE WITHOUT CONTRAST  Technique:  Multidetector CT imaging of the head and cervical spine was performed following the standard protocol without intravenous contrast.  Multiplanar CT image reconstructions of the cervical spine were also generated.  Comparison:  Head and cervical spine CT scan 09/06/2010 and head CT scan 01/15/2012.  CT HEAD  Findings: Cortical atrophy and chronic microvascular ischemic change are  again seen.  No evidence of acute abnormality including infarct ridge, mass lesion, mass effect, midline shift or abnormal extra-axial fluid collection is identified.  There is mild mucosal thickening in the maxillary sinuses, greater on the right. Scattered ethmoid air cell disease is also noted.  Atherosclerosis is seen.  Calvarium intact.  IMPRESSION: No acute finding.  CT CERVICAL SPINE  Findings: There is no fracture.  Facet mediated anterolisthesis of C3 on C4 and C4 on C5 is unchanged.  C6-7 fusion is again identified. Marked multilevel loss of disc space height appears unchanged.  Paraspinous structures are unremarkable.  Lung apices are clear.  IMPRESSION: No acute finding.  Marked multilevel degenerative change.   Original Report Authenticated By: Holley Dexter, M.D.    Mr Brain Wo Contrast  04/14/2012  *RADIOLOGY REPORT*  Clinical Data: 79 year old female status post fall. Abnormal gait. Possible stroke.  MRI HEAD WITHOUT CONTRAST  Technique:  Multiplanar, multiecho pulse sequences of the brain and surrounding structures were obtained according to standard protocol without intravenous contrast.  Comparison: Head CTs without contrast 04/14/2012 and earlier. Brain MRI 04/01/2004.  Findings: No restricted diffusion to suggest acute infarction.  No midline shift, mass effect, evidence of mass lesion, ventriculomegaly, extra-axial collection or acute intracranial hemorrhage.  Cervicomedullary junction and pituitary are within normal limits.  Intermittent mild motion  artifact despite repeated imaging attempts.  Largely unremarkable for age gray and white matter signal; occasional cerebral white matter nonspecific T2 and FLAIR hyperintensity.  No definite cortical encephalomalacia. Cerebral volume has only mildly decreased since 2006. Major intracranial vascular flow voids are stable.  Cervical spine obscured by motion.  Stable orbit soft tissues.  Mild to moderate paranasal sinus mucosal thickening is increased.  Stable trace right mastoid fluid. Small volume retained secretions in the nasopharynx.  Scalp soft tissues remarkable for susceptibility artifact posteriorly which may relate to skin staples.  No large scalp hematoma.  IMPRESSION: No acute intracranial abnormality.  MRI appearance of the brain largely normal for age and not significantly changed since 2006.   Original Report Authenticated By: Erskine Speed, M.D.    Dg Chest Port 1 View  04/14/2012  *RADIOLOGY REPORT*  Clinical Data: Pneumonia  PORTABLE CHEST - 1 VIEW  Comparison: Chest radiograph 01/15/2012  Findings: Normal mediastinum and heart silhouette.  There are low lung volumes.  No effusion, infiltrate, or pneumothorax. Basilar atelectasis increased compared to prior.  Low lung volumes.  IMPRESSION: Bibasilar atelectasis versus infiltrates.   Original Report Authenticated By: Genevive Bi, M.D.     Scheduled Meds:   . azithromycin  500 mg Intravenous Q24H  . cefTRIAXone (ROCEPHIN)  IV  1 g Intravenous Q24H  . donepezil  5 mg Oral QHS  . FLUoxetine  20 mg Oral Daily  . gabapentin  300 mg Oral Daily  . gabapentin  600 mg Oral QHS  . levothyroxine  50 mcg Oral Daily  . risperiDONE  1 mg Oral QHS,MR X 1  . simvastatin  10 mg Oral QHS   Continuous Infusions:   . sodium chloride 100 mL/hr at 04/15/12 0800    Active Problems:  * No active hospital problems. *    Time spent: 35 minutes.     Rendon Howell  [facility] Pager [phone]. If 8PM-8AM, please contact night-coverage at  [email or web address], password [facility] 04/15/2012, 10:50 AM  LOS: 1 day

## 2012-04-15 NOTE — Clinical Documentation Improvement (Signed)
Abnormal Labs Clarification  THIS DOCUMENT IS NOT A PERMANENT PART OF THE MEDICAL RECORD  TO RESPOND TO THE THIS QUERY, FOLLOW THE INSTRUCTIONS BELOW:  1. If needed, update documentation for the patient's encounter via the notes activity.  2. Access this query again and click edit on the [employer].  3. After updating, or not, click F2 to complete all highlighted (required) fields concerning your review. Select "additional documentation in the medical record" OR "no additional documentation provided".  4. Click Sign note button.  5. The deficiency will fall out of your InBasket *Please let us know if you are not able to complete this workflow by phone or e-mail (listed below).  Please update your documentation within the medical record to reflect your response to this query.                                                                                   04/15/12  Dear Dr. Lonia Farber / [facility]  In a better effort to capture your patient's severity of illness, reflect appropriate length of stay and utilization of resources, a review of the medical record has revealed the following indicators.    Based on your clinical judgment, please clarify and document in a progress note and/or discharge summary the clinical condition associated with the following supporting information:  In responding to this query please exercise your independent judgment.  The fact that a query is asked, does not imply that any particular answer is desired or expected.  Abnormal findings (laboratory, x-ray, pathologic, and other diagnostic results) are not coded and reported unless the physician indicates their clinical significance.   The medical record reflects the following clinical findings, please clarify the diagnostic and/or clinical significance:       Noted per chest xray impressions:  "Bibasilar atelectasis versus infiltrates" , if appropriate please define lab values.  Thank you    Possible Clinical Conditions?                                 Atelectasis  Early Pneumonia  Abnormal xray results  Other Condition             Cannot Clinically Determine:        Treatment: PRN albuterol  0.5% nebulizer 2.5 mg,  azithromycin 500 mg  IVPB q 24hrs, cefTRIAXone 1 g IVPB q 24 hours  -Pna: Chest x ray show bilateral atelectasis vs infiltrates      Reviewed:   Thank You,  Leonette Most Gavyn Zoss  Clinical Documentation Specialist, BSN: Pager [phone]  [facility] [place]

## 2012-04-15 NOTE — Progress Notes (Signed)
Utilization review completed.  

## 2012-04-15 NOTE — Evaluation (Signed)
Physical Therapy Evaluation Patient Details Name: [name] MRN: [id number] DOB: 1926-07-23 Today's Date: 04/15/2012 Time: 9562-1308 PT Time Calculation (min): 26 min  PT Assessment / Plan / Recommendation Clinical Impression  Pt adm with fall, scalp laceration and ams.  Pt with decline in mobility since I last saw her in November.  Needs skilled PT to maximize I and safety to decr burden of care.  Recommend ST-SNF.    PT Assessment  Patient needs continued PT services    Follow Up Recommendations  SNF    Does the patient have the potential to tolerate intense rehabilitation      Barriers to Discharge        Equipment Recommendations  None recommended by PT    Recommendations for Other Services     Frequency Min 2X/week    Precautions / Restrictions Precautions Precautions: Fall Restrictions Weight Bearing Restrictions: No   Pertinent Vitals/Pain Pt c/o bil legs aching.      Mobility  Bed Mobility Bed Mobility: Supine to Sit;Sitting - Scoot to Edge of Bed Supine to Sit: 3: Mod assist;HOB elevated Sitting - Scoot to Edge of Bed: 3: Mod assist Details for Bed Mobility Assistance: Assist to bring trunk up. Transfers Transfers: Sit to Stand;Stand to Sit Sit to Stand: 3: Mod assist;With upper extremity assist;From bed Stand to Sit: 3: Mod assist;With upper extremity assist;To chair/3-in-1 Details for Transfer Assistance: Assist to bring hips up.   Ambulation/Gait Ambulation/Gait Assistance: 3: Mod assist Ambulation Distance (Feet): 3 Feet Assistive device: Rolling walker Ambulation/Gait Assistance Details: Verbal/tactile cues to extend hips and knees. Gait Pattern: Step-to pattern;Decreased step length - right;Decreased step length - left;Shuffle;Left flexed knee in stance;Right flexed knee in stance;Trunk flexed Gait velocity: slow General Gait Details: pt with backwards stagger.    Exercises     PT Diagnosis: Difficulty walking;Generalized weakness  PT  Problem List: Decreased strength;Decreased activity tolerance;Decreased balance;Decreased mobility;Decreased knowledge of precautions;Decreased knowledge of use of DME PT Treatment Interventions:     PT Goals Acute Rehab PT Goals PT Goal Formulation: Patient unable to participate in goal setting Time For Goal Achievement: 04/22/12 Potential to Achieve Goals: Good Pt will go Supine/Side to Sit: with min assist PT Goal: Supine/Side to Sit - Progress: Goal set today Pt will go Sit to Supine/Side: with min assist PT Goal: Sit to Supine/Side - Progress: Goal set today Pt will go Sit to Stand: with min assist PT Goal: Sit to Stand - Progress: Goal set today Pt will go Stand to Sit: with min assist PT Goal: Stand to Sit - Progress: Goal set today Pt will Ambulate: 51 - 150 feet;with min assist;with least restrictive assistive device PT Goal: Ambulate - Progress: Goal set today  Visit Information  Last PT Received On: 04/15/12 Assistance Needed: +1    Subjective Data  Subjective: "I've never been here before," pt stated when I told her I remembered her from last visit. Patient Stated Goal: Pt didn't state.   Prior Functioning  Home Living Lives With: Son Available Help at Discharge: Skilled Nursing Facility Type of Home: Skilled Nursing Facility Home Adaptive Equipment: Walker - rolling Prior Function Level of Independence: Needs assistance Needs Assistance: Gait;Transfers Gait Assistance: Pt needs assist to amb. Driving: No Vocation: Retired Musician: Surveyor, mining Overall Cognitive Status: History of cognitive impairments - at baseline Arousal/Alertness: Awake/alert Orientation Level: Disoriented to;Place;Time;Situation Behavior During Session: WFL for tasks performed    Extremity/Trunk Assessment Right Lower Extremity Assessment RLE  ROM/Strength/Tone: Deficits RLE ROM/Strength/Tone Deficits: grossly 3+/5 Left Lower Extremity  Assessment LLE ROM/Strength/Tone: Deficits LLE ROM/Strength/Tone Deficits: grossly 3+/5   Balance Static Sitting Balance Static Sitting - Balance Support: Bilateral upper extremity supported Static Sitting - Level of Assistance: 4: Min assist (pt leaning lt and posteriorly) Static Standing Balance Static Standing - Balance Support: Bilateral upper extremity supported (on walker) Static Standing - Level of Assistance: 3: Mod assist  End of Session PT - End of Session Equipment Utilized During Treatment: Gait belt Activity Tolerance: Patient limited by fatigue Patient left: in chair;with call bell/phone within reach Nurse Communication: Mobility status  GP     [facility] 04/15/2012, 10:11 AM  [facility] PT [phone]

## 2012-04-16 DIAGNOSIS — J189 Pneumonia, unspecified organism: Secondary | ICD-10-CM

## 2012-04-16 LAB — BASIC METABOLIC PANEL
GFR calc Af Amer: 88 mL/min — ABNORMAL LOW (ref >90)
GFR calc non Af Amer: 76 mL/min — ABNORMAL LOW (ref >90)
Potassium: 3.6 mEq/L (ref 3.5–5.1)
Sodium: 144 mEq/L (ref 135–145)

## 2012-04-16 LAB — CBC
Hemoglobin: 10.8 g/dL — ABNORMAL LOW (ref 12.0–15.0)
MCHC: 32.5 g/dL (ref 30.0–36.0)
RDW: 14.7 % (ref 11.5–15.5)
WBC: 6.4 10*3/uL (ref 4.0–10.5)

## 2012-04-16 MED ORDER — HYDRALAZINE HCL 20 MG/ML IJ SOLN: 10.0000 mg | Freq: Three times a day (TID) | INTRAMUSCULAR | Status: DC | PRN

## 2012-04-16 MED ORDER — LEVOFLOXACIN 500 MG PO TABS
500.0000 mg | ORAL_TABLET | Freq: Every day | ORAL | Status: DC
  Administered 2012-04-16 – 2012-04-17 (×2): 500 mg via ORAL
  Filled 2012-04-16 (×2): qty 1

## 2012-04-16 MED ORDER — FUROSEMIDE 40 MG PO TABS
40.0000 mg | ORAL_TABLET | Freq: Every day | ORAL | Status: DC
  Administered 2012-04-16 – 2012-04-17 (×2): 40 mg via ORAL
  Filled 2012-04-16 (×2): qty 1

## 2012-04-16 MED ORDER — METOPROLOL TARTRATE 25 MG PO TABS
25.0000 mg | ORAL_TABLET | Freq: Every day | ORAL | Status: DC
  Administered 2012-04-16 – 2012-04-17 (×2): 25 mg via ORAL
  Filled 2012-04-16 (×2): qty 1

## 2012-04-16 MED ORDER — METOPROLOL TARTRATE 25 MG PO TABS
25.0000 mg | ORAL_TABLET | Freq: Every day | ORAL | Status: DC
  Filled 2012-04-16: qty 1

## 2012-04-16 NOTE — Progress Notes (Signed)
Patient transferred to unit 3 [place] and to this CSW today- patient is set to d/c to SNF bed at [facility] once medically stable for d/c. Will follow and update SNF.  Reece Levy, MSW, Theresia Majors [phone]

## 2012-04-16 NOTE — Progress Notes (Signed)
[facility] PROGRESS NOTE  [name] ZOX:[id number] DOB: 17-Oct-1926 DOA: 04/14/2012 PCP: Billee Cashing, MD  Assessment/Plan:  1-Altered mental status/ Encephalopathy: Resolved.  CT of the head is negative.  MRI brain negative for  stroke.   2-Pna: Chest x ray show bilateral atelectasis vs infiltrates  Recieved Rocephin and Zithromax day 2.  Blood cultures x2 no growth to date.  Start Levaquin. Plan for discharge 2-7. Day 3 antibiotics.   3-Frequent falls  Patient has been having frequent falls over the past 3 months  Family is unable to take care of patient  SNF when stable.   4-Dementia  Patient will continue her Aricept  5-History of Hypertension  BP was low on admission.  Resume lasix and metoprolol.  6-Diarrhea:  C diff negative. Resolved. 7-Diastolic Hearth Failure, chronic, compensated. Resume lasix. ECHO 02-06: Left ventricle: The cavity size was normal. Wall thickness was increased in a pattern of mild LVH. Systolic function was normal. The estimated ejection fraction was in the range of 60% to 65%. Wall motion was normal; there were no regional wall motion abnormalities. Doppler parameters are consistent with abnormal left ventricular relaxation (grade 1 diastolic dysfunction). Aortic valve: Mild regurgitation. Mitral valve: Mild regurgitation.    Code Status: DNR.  Family Communication: Son at bedside plan of care discussed with him.  Disposition Plan: SNF when stable.    Consultants:  none  Procedures:  None.  Antibiotics:  Ceftriaxone 2/04----2/06  Azithro 2/4---2/06  Levaquin 2-06  HPI/Subjective: Feeling better. Back to baseline.  Cough and dyspnea better.   Objective: Filed Vitals:   04/16/12 0000 04/16/12 0400 04/16/12 0702 04/16/12 1000  BP: 175/76 183/75 155/75 174/65  Pulse: 56 59  68  Temp: 98.6 F (37 C) 97.7 F (36.5 C)  97.6 F (36.4 C)  TempSrc:      Resp: 18 16  17   Height:      Weight:      SpO2: 93% 94%  96%     Intake/Output Summary (Last 24 hours) at 04/16/12 1100 Last data filed at 04/16/12 0810  Gross per 24 hour  Intake    990 ml  Output    400 ml  Net    590 ml   Filed Weights   04/14/12 1700  Weight: 62 kg (136 lb 11 oz)    Exam:   General:  No distress, awake, following   Cardiovascular: S 1, S 2 RRR  Respiratory: Crackles bases  Abdomen: Bs present, soft, NT  Data Reviewed: Basic Metabolic Panel:  Lab 04/16/12 8119 04/15/12 0400 04/14/12 0827  NA 144 143 141  K 3.6 3.1* 4.0  CL 109 107 105  CO2 25 25 --  GLUCOSE 97 95 120*  BUN 10 11 18   CREATININE 0.77 0.69 1.10  CALCIUM 10.3 9.9 --  MG -- -- --  PHOS -- -- --   Liver Function Tests:  Lab 04/15/12 0400 04/14/12 1130  AST 53* 31  ALT 28 17  ALKPHOS 86 94  BILITOT 0.2* 0.2*  PROT 6.2 6.8  ALBUMIN 3.3* 3.6   No results found for this basename: LIPASE:5,AMYLASE:5 in the last 168 hours  Lab 04/14/12 1143  AMMONIA 21   CBC:  Lab 04/15/12 0400 04/14/12 1143 04/14/12 0827  WBC 5.1 10.9* --  NEUTROABS -- 8.8* --  HGB 10.7* 12.4 13.3  HCT 33.3* 38.1 39.0  MCV 90.0 90.1 --  PLT 129* 152 --   Cardiac Enzymes: No results found for this basename: CKTOTAL:5,CKMB:5,CKMBINDEX:5,TROPONINI:5  in the last 168 hours BNP (last 3 results)  Basename 04/14/12 1501  PROBNP 675.7*   CBG: No results found for this basename: GLUCAP:5 in the last 168 hours  Recent Results (from the past 240 hour(s))  CULTURE, BLOOD (ROUTINE X 2)     Status: Normal (Preliminary result)   Collection Time   04/14/12 11:15 AM      Component Value Range Status Comment   Specimen Description BLOOD RIGHT ANTECUBITAL   Final    Special Requests BOTTLES DRAWN AEROBIC ONLY 2CC   Final    Culture  Setup Time 04/14/2012 15:51   Final    Culture     Final    Value:        BLOOD CULTURE RECEIVED NO GROWTH TO DATE CULTURE WILL BE HELD FOR 5 DAYS BEFORE ISSUING A FINAL NEGATIVE REPORT   Report Status PENDING   Incomplete   CULTURE, BLOOD  (ROUTINE X 2)     Status: Normal (Preliminary result)   Collection Time   04/14/12 11:20 AM      Component Value Range Status Comment   Specimen Description BLOOD HAND LEFT   Final    Special Requests BOTTLES DRAWN AEROBIC ONLY 2CC   Final    Culture  Setup Time 04/14/2012 15:49   Final    Culture     Final    Value:        BLOOD CULTURE RECEIVED NO GROWTH TO DATE CULTURE WILL BE HELD FOR 5 DAYS BEFORE ISSUING A FINAL NEGATIVE REPORT   Report Status PENDING   Incomplete   MRSA PCR SCREENING     Status: Normal   Collection Time   04/14/12  5:34 PM      Component Value Range Status Comment   MRSA by PCR NEGATIVE  NEGATIVE Final   CLOSTRIDIUM DIFFICILE BY PCR     Status: Normal   Collection Time   04/15/12  7:58 PM      Component Value Range Status Comment   C difficile by pcr NEGATIVE  NEGATIVE Final      Studies: Mr Brain Wo Contrast  04/14/2012  *RADIOLOGY REPORT*  Clinical Data: 79 year old female status post fall. Abnormal gait. Possible stroke.  MRI HEAD WITHOUT CONTRAST  Technique:  Multiplanar, multiecho pulse sequences of the brain and surrounding structures were obtained according to standard protocol without intravenous contrast.  Comparison: Head CTs without contrast 04/14/2012 and earlier. Brain MRI 04/01/2004.  Findings: No restricted diffusion to suggest acute infarction.  No midline shift, mass effect, evidence of mass lesion, ventriculomegaly, extra-axial collection or acute intracranial hemorrhage.  Cervicomedullary junction and pituitary are within normal limits.  Intermittent mild motion artifact despite repeated imaging attempts.  Largely unremarkable for age gray and white matter signal; occasional cerebral white matter nonspecific T2 and FLAIR hyperintensity.  No definite cortical encephalomalacia. Cerebral volume has only mildly decreased since 2006. Major intracranial vascular flow voids are stable.  Cervical spine obscured by motion.  Stable orbit soft tissues.  Mild to  moderate paranasal sinus mucosal thickening is increased.  Stable trace right mastoid fluid. Small volume retained secretions in the nasopharynx.  Scalp soft tissues remarkable for susceptibility artifact posteriorly which may relate to skin staples.  No large scalp hematoma.  IMPRESSION: No acute intracranial abnormality.  MRI appearance of the brain largely normal for age and not significantly changed since 2006.   Original Report Authenticated By: Erskine Speed, M.D.    Dg Chest [facility]  04/14/2012  *RADIOLOGY REPORT*  Clinical Data: Pneumonia  PORTABLE CHEST - 1 VIEW  Comparison: Chest radiograph 01/15/2012  Findings: Normal mediastinum and heart silhouette.  There are low lung volumes.  No effusion, infiltrate, or pneumothorax. Basilar atelectasis increased compared to prior.  Low lung volumes.  IMPRESSION: Bibasilar atelectasis versus infiltrates.   Original Report Authenticated By: Genevive Bi, M.D.     Scheduled Meds:    . azithromycin  500 mg Intravenous Q24H  . cefTRIAXone (ROCEPHIN)  IV  1 g Intravenous Q24H  . donepezil  5 mg Oral QHS  . FLUoxetine  20 mg Oral Daily  . furosemide  40 mg Oral Daily  . gabapentin  300 mg Oral Daily  . gabapentin  600 mg Oral QHS  . levothyroxine  50 mcg Oral Daily  . metoprolol tartrate  25 mg Oral Daily  . risperiDONE  1 mg Oral QHS,MR X 1  . simvastatin  10 mg Oral QHS   Continuous Infusions:   Active Problems:  * No active hospital problems. *    Time spent: 25 minutes.     Jcion Buddenhagen  [facility] Pager [phone]. If 8PM-8AM, please contact night-coverage at [email or web address], password [facility] 04/16/2012, 11:00 AM  LOS: 2 days

## 2012-04-17 DIAGNOSIS — J189 Pneumonia, unspecified organism: Secondary | ICD-10-CM

## 2012-04-17 LAB — BASIC METABOLIC PANEL
Chloride: 103 mEq/L (ref 96–112)
GFR calc Af Amer: 59 mL/min — ABNORMAL LOW (ref >90)
Potassium: 3.2 mEq/L — ABNORMAL LOW (ref 3.5–5.1)

## 2012-04-17 MED ORDER — DIAZEPAM 5 MG PO TABS: 5.0000 mg | ORAL_TABLET | Freq: Two times a day (BID) | ORAL | Status: DC | PRN

## 2012-04-17 MED ORDER — POTASSIUM CHLORIDE CRYS ER 20 MEQ PO TBCR: 40.0000 meq | EXTENDED_RELEASE_TABLET | Freq: Every day | ORAL | Status: DC

## 2012-04-17 MED ORDER — POTASSIUM CHLORIDE CRYS ER 20 MEQ PO TBCR
40.0000 meq | EXTENDED_RELEASE_TABLET | Freq: Once | ORAL | Status: AC
  Administered 2012-04-17: 40 meq via ORAL
  Filled 2012-04-17: qty 2

## 2012-04-17 MED ORDER — GUAIFENESIN 200 MG PO TABS: 200.0000 mg | ORAL_TABLET | Freq: Three times a day (TID) | ORAL | Status: DC

## 2012-04-17 MED ORDER — HYDROCODONE-ACETAMINOPHEN 7.5-325 MG PO TABS: 1.0000 | ORAL_TABLET | ORAL | Status: DC | PRN

## 2012-04-17 MED ORDER — GUAIFENESIN 200 MG PO TABS
400.0000 mg | ORAL_TABLET | Freq: Three times a day (TID) | ORAL | Status: DC
  Administered 2012-04-17: 400 mg via ORAL
  Filled 2012-04-17 (×3): qty 2

## 2012-04-17 MED ORDER — LEVOFLOXACIN 500 MG PO TABS: 500.0000 mg | ORAL_TABLET | Freq: Every day | ORAL | Status: DC

## 2012-04-17 MED ORDER — GUAIFENESIN 200 MG PO TABS
200.0000 mg | ORAL_TABLET | Freq: Three times a day (TID) | ORAL | Status: DC
  Filled 2012-04-17 (×3): qty 1

## 2012-04-17 NOTE — Progress Notes (Signed)
Patient for d/c today to SNF bed at [facility]. Son and patient agreeable to this plan- plan transfer via EMS. Reece Levy, MSW, Theresia Majors [phone]

## 2012-04-17 NOTE — Progress Notes (Signed)
Clinical Social Work Department CLINICAL SOCIAL WORK PLACEMENT NOTE 04/17/2012  Patient:  [name], [name]  Account Number:  [id number] Admit date:  04/14/2012  Clinical Social Worker:  Robin Searing  Date/time:  04/14/2012 03:09 PM  Clinical Social Work is seeking post-discharge placement for this patient at the following level of care:   SKILLED NURSING   (*CSW will update this form in Epic as items are completed)   04/14/2012  Patient/family provided with [facility] Department of Clinical Social Work's list of facilities offering this level of care within the geographic area requested by the patient (or if unable, by the patient's family).  04/14/2012  Patient/family informed of their freedom to choose among providers that offer the needed level of care, that participate in Medicare, Medicaid or managed care program needed by the patient, have an available bed and are willing to accept the patient.  04/14/2012  Patient/family informed of [facility]' ownership interest in [facility], as well as of the fact that they are under no obligation to receive care at this facility.  PASARR submitted to EDS on 04/14/2012 PASARR number received from EDS on 04/14/2012  FL2 transmitted to all facilities in geographic area requested by pt/family on  04/14/2012 FL2 transmitted to all facilities within larger geographic area on   Patient informed that his/her managed care company has contracts with or will negotiate with  certain facilities, including the following:   Patient has existing PASARR     Patient/family informed of bed offers received:  04/17/2012 Patient chooses bed at [facility] Physician recommends and patient chooses bed at    Patient to be transferred to [facility] on  04/17/2012 Patient to be transferred to facility by Ambulance  [facility])  The following physician request were entered in Epic:   Additional Comments: 04/15/12  Patient was admitted to unit  3300 from the E.D. Theresia Bough, LCSWA is aware of patient.  Bed offer recieved from Tammy at [facility] which is bed of choice per patient's son. Unit CSW will follow for medical stability.  Reece Levy, MSW, Theresia Majors [phone]

## 2012-04-17 NOTE — Discharge Summary (Addendum)
Physician Discharge Summary  [name] AOZ:[id number] DOB: 1926/05/16 DOA: 04/14/2012  PCP: Billee Cashing, MD  Admit date: 04/14/2012 Discharge date: 04/17/2012  Time spent: 30 minutes  Recommendations for Outpatient Follow-up:  1.  Bmet to follow potasium and renal function.  2. Resolution of symptoms. Cough.  3. Needs to have staple remove.   Discharge Diagnoses:  Active Problems:  Pneumonia   Discharge Condition: Stable.  Diet recommendation: Hearth Healthy.   Filed Weights   04/14/12 1700  Weight: 62 kg (136 lb 11 oz)    History of present illness:  79 year old female with a history of dementia, MI, asthma, hypothyroidism, hypertension, gait disorder, frequent falls who was discharged from [facility] in November for rehabilitation at [facility]. Patient was discharged from the [facility] placed in December. As per family patient has continued to decline since December she has been frequently falling at home has poor by mouth intake. Patient was seen at her primary care physician's office on 04/13/2012 for possible bronchitis and was started on by mouth Zithromax. This morning patient was found on the floor with bleeding from her head. Patient was found to have laceration in the posterior scalp. Patient also was found to be picking berries at home. Patient at this time is very somnolent, unable to follow commands though able to answer a few questions. Most of history is obtained from the patient's son and her sister in law.   Hospital Course:  1-Altered mental status/ Encephalopathy: Resolved.  CT of the head is negative.  MRI brain negative for stroke.   2-Pna: Chest x ray show bilateral atelectasis vs infiltrates  Recieved Rocephin and Zithromax for 2 days.  Blood cultures x2 no growth to date.   Day 4 antibiotics. Patient will be discharge on Levaquin for 6 more days.  Breathing better, cough persist. Will start schedule guaifenesin for cough. No fevers, no  leukocytosis.  If patient stable, she will be discharge this afternoon.   3-Frequent falls  She had scalp laceration. Needs to have staple remove. Patient has been having frequent falls over the past 3 months  Family is unable to take care of patient  SNF when stable.  Fall precaution at [facility].  4-Dementia  Patient will continue her Aricept  5-History of Hypertension  BP was low on admission. Resume lasix and metoprolol. Bp stable.  6-Diarrhea: C diff negative. Resolved.  7-Diastolic Hearth Failure, chronic, compensated. Continue with  lasix. Started daily Potassium supplement. ECHO 02-06: Left ventricle: The cavity size was normal. Wall thickness was increased in a pattern of mild LVH. Systolic function was normal. The estimated ejection fraction was in the range of 60% to 65%. Wall motion was normal; there were no regional wall motion abnormalities. Doppler parameters are consistent with abnormal left ventricular relaxation (grade 1 diastolic dysfunction). Aortic valve: Mild regurgitation. Mitral valve: Mild regurgitation.     Procedures:  None  Consultations:  None  Discharge Exam: Filed Vitals:   04/16/12 2000 04/17/12 0000 04/17/12 0400 04/17/12 0800  BP: 149/86 106/69 117/74 131/51  Pulse: 68 52 70 70  Temp: 97.5 F (36.4 C) 97.2 F (36.2 C) 97.3 F (36.3 C) 97.4 F (36.3 C)  TempSrc: Oral Oral Oral   Resp: 18 18 18 17   Height:      Weight:      SpO2: 96% 94% 92% 95%    General: No distress. Cardiovascular: S 1, S 2 RRR Respiratory: CTA  Discharge Instructions  Discharge Orders  Future Orders Please Complete By Expires   Walker rolling      Diet - low sodium heart healthy      Increase activity slowly          Medication List     As of 04/17/2012  9:13 AM    STOP taking these medications         azithromycin 250 MG tablet   Commonly known as: ZITHROMAX      TAKE these medications         acetaminophen 325 MG tablet   Commonly known as:  TYLENOL   Take 2 tablets (650 mg total) by mouth every 6 (six) hours as needed for pain (or Fever >/= 101).      albuterol (2.5 MG/3ML) 0.083% nebulizer solution   Commonly known as: PROVENTIL   Take 2.5 mg by nebulization every 4 (four) hours as needed. For shortness of breath      allopurinol 100 MG tablet   Commonly known as: ZYLOPRIM   Take 100 mg by mouth daily.      aspirin 325 MG tablet   Take 325 mg by mouth daily.      diazepam 5 MG tablet   Commonly known as: VALIUM   Take 1 tablet (5 mg total) by mouth every 12 (twelve) hours as needed. For anxiety      donepezil 5 MG tablet   Commonly known as: ARICEPT   Take 5 mg by mouth at bedtime.      FLUoxetine 20 MG capsule   Commonly known as: PROZAC   Take 20 mg by mouth daily.      fluticasone 50 MCG/ACT nasal spray   Commonly known as: FLONASE   Place 2 sprays into the nose daily.      furosemide 40 MG tablet   Commonly known as: LASIX   Take 40 mg by mouth daily.      gabapentin 300 MG capsule   Commonly known as: NEURONTIN   Take 300-600 mg by mouth 2 (two) times daily. 1 cap in AM and 2 in PM      guaiFENesin 200 MG tablet   Take 1 tablet (200 mg total) by mouth every 8 (eight) hours.      HYDROcodone-acetaminophen 7.5-325 MG per tablet   Commonly known as: NORCO   Take 1 tablet by mouth every 4 (four) hours as needed. For pain      levofloxacin 500 MG tablet   Commonly known as: LEVAQUIN   Take 1 tablet (500 mg total) by mouth daily.      levothyroxine 50 MCG tablet   Commonly known as: SYNTHROID, LEVOTHROID   Take 50 mcg by mouth daily.      metoprolol tartrate 25 MG tablet   Commonly known as: LOPRESSOR   Take 25 mg by mouth daily.      potassium chloride SA 20 MEQ tablet   Commonly known as: K-DUR,KLOR-CON   Take 2 tablets (40 mEq total) by mouth daily.      risperiDONE 1 MG tablet   Commonly known as: RISPERDAL   Take 1 mg by mouth at bedtime and may repeat dose one time if needed.       simvastatin 10 MG tablet   Commonly known as: ZOCOR   Take 10 mg by mouth at bedtime.      tiotropium 18 MCG inhalation capsule   Commonly known as: SPIRIVA   Place 18 mcg into inhaler and inhale daily.  VITAMIN D PO   Take 1 tablet by mouth daily.          The results of significant diagnostics from this hospitalization (including imaging, microbiology, ancillary and laboratory) are listed below for reference.    Significant Diagnostic Studies: Dg Pelvis 1-2 Views  04/14/2012  *RADIOLOGY REPORT*  Clinical Data: Status post fall.  Pain.  PELVIS - 1-2 VIEW  Comparison: Single view of the pelvis 01/15/2012.  Findings: The hips are located.  No fracture is identified.  Mild appearing bilateral hip degenerative change is noted.  Convex right lumbar scoliosis and degenerative disease are partially visualized.  IMPRESSION: No acute finding.   Original Report Authenticated By: Holley Dexter, M.D.    Ct Head Wo Contrast  04/14/2012  *RADIOLOGY REPORT*  Clinical Data:  Status post fall.  CT HEAD WITHOUT CONTRAST CT CERVICAL SPINE WITHOUT CONTRAST  Technique:  Multidetector CT imaging of the head and cervical spine was performed following the standard protocol without intravenous contrast.  Multiplanar CT image reconstructions of the cervical spine were also generated.  Comparison:  Head and cervical spine CT scan 09/06/2010 and head CT scan 01/15/2012.  CT HEAD  Findings: Cortical atrophy and chronic microvascular ischemic change are again seen.  No evidence of acute abnormality including infarct ridge, mass lesion, mass effect, midline shift or abnormal extra-axial fluid collection is identified.  There is mild mucosal thickening in the maxillary sinuses, greater on the right. Scattered ethmoid air cell disease is also noted.  Atherosclerosis is seen.  Calvarium intact.  IMPRESSION: No acute finding.  CT CERVICAL SPINE  Findings: There is no fracture.  Facet mediated anterolisthesis of C3 on C4  and C4 on C5 is unchanged.  C6-7 fusion is again identified. Marked multilevel loss of disc space height appears unchanged.  Paraspinous structures are unremarkable.  Lung apices are clear.  IMPRESSION: No acute finding.  Marked multilevel degenerative change.   Original Report Authenticated By: Holley Dexter, M.D.    Ct Cervical Spine Wo Contrast  04/14/2012  *RADIOLOGY REPORT*  Clinical Data:  Status post fall.  CT HEAD WITHOUT CONTRAST CT CERVICAL SPINE WITHOUT CONTRAST  Technique:  Multidetector CT imaging of the head and cervical spine was performed following the standard protocol without intravenous contrast.  Multiplanar CT image reconstructions of the cervical spine were also generated.  Comparison:  Head and cervical spine CT scan 09/06/2010 and head CT scan 01/15/2012.  CT HEAD  Findings: Cortical atrophy and chronic microvascular ischemic change are again seen.  No evidence of acute abnormality including infarct ridge, mass lesion, mass effect, midline shift or abnormal extra-axial fluid collection is identified.  There is mild mucosal thickening in the maxillary sinuses, greater on the right. Scattered ethmoid air cell disease is also noted.  Atherosclerosis is seen.  Calvarium intact.  IMPRESSION: No acute finding.  CT CERVICAL SPINE  Findings: There is no fracture.  Facet mediated anterolisthesis of C3 on C4 and C4 on C5 is unchanged.  C6-7 fusion is again identified. Marked multilevel loss of disc space height appears unchanged.  Paraspinous structures are unremarkable.  Lung apices are clear.  IMPRESSION: No acute finding.  Marked multilevel degenerative change.   Original Report Authenticated By: Holley Dexter, M.D.    Mr Brain Wo Contrast  04/14/2012  *RADIOLOGY REPORT*  Clinical Data: 79 year old female status post fall. Abnormal gait. Possible stroke.  MRI HEAD WITHOUT CONTRAST  Technique:  Multiplanar, multiecho pulse sequences of the brain and surrounding structures were obtained  according  to standard protocol without intravenous contrast.  Comparison: Head CTs without contrast 04/14/2012 and earlier. Brain MRI 04/01/2004.  Findings: No restricted diffusion to suggest acute infarction.  No midline shift, mass effect, evidence of mass lesion, ventriculomegaly, extra-axial collection or acute intracranial hemorrhage.  Cervicomedullary junction and pituitary are within normal limits.  Intermittent mild motion artifact despite repeated imaging attempts.  Largely unremarkable for age gray and white matter signal; occasional cerebral white matter nonspecific T2 and FLAIR hyperintensity.  No definite cortical encephalomalacia. Cerebral volume has only mildly decreased since 2006. Major intracranial vascular flow voids are stable.  Cervical spine obscured by motion.  Stable orbit soft tissues.  Mild to moderate paranasal sinus mucosal thickening is increased.  Stable trace right mastoid fluid. Small volume retained secretions in the nasopharynx.  Scalp soft tissues remarkable for susceptibility artifact posteriorly which may relate to skin staples.  No large scalp hematoma.  IMPRESSION: No acute intracranial abnormality.  MRI appearance of the brain largely normal for age and not significantly changed since 2006.   Original Report Authenticated By: Erskine Speed, M.D.    Dg Chest Port 1 View  04/14/2012  *RADIOLOGY REPORT*  Clinical Data: Pneumonia  PORTABLE CHEST - 1 VIEW  Comparison: Chest radiograph 01/15/2012  Findings: Normal mediastinum and heart silhouette.  There are low lung volumes.  No effusion, infiltrate, or pneumothorax. Basilar atelectasis increased compared to prior.  Low lung volumes.  IMPRESSION: Bibasilar atelectasis versus infiltrates.   Original Report Authenticated By: Genevive Bi, M.D.     Microbiology: Recent Results (from the past 240 hour(s))  CULTURE, BLOOD (ROUTINE X 2)     Status: Normal (Preliminary result)   Collection Time   04/14/12 11:15 AM      Component  Value Range Status Comment   Specimen Description BLOOD RIGHT ANTECUBITAL   Final    Special Requests BOTTLES DRAWN AEROBIC ONLY 2CC   Final    Culture  Setup Time 04/14/2012 15:51   Final    Culture     Final    Value:        BLOOD CULTURE RECEIVED NO GROWTH TO DATE CULTURE WILL BE HELD FOR 5 DAYS BEFORE ISSUING A FINAL NEGATIVE REPORT   Report Status PENDING   Incomplete   CULTURE, BLOOD (ROUTINE X 2)     Status: Normal (Preliminary result)   Collection Time   04/14/12 11:20 AM      Component Value Range Status Comment   Specimen Description BLOOD HAND LEFT   Final    Special Requests BOTTLES DRAWN AEROBIC ONLY 2CC   Final    Culture  Setup Time 04/14/2012 15:49   Final    Culture     Final    Value:        BLOOD CULTURE RECEIVED NO GROWTH TO DATE CULTURE WILL BE HELD FOR 5 DAYS BEFORE ISSUING A FINAL NEGATIVE REPORT   Report Status PENDING   Incomplete   MRSA PCR SCREENING     Status: Normal   Collection Time   04/14/12  5:34 PM      Component Value Range Status Comment   MRSA by PCR NEGATIVE  NEGATIVE Final   CLOSTRIDIUM DIFFICILE BY PCR     Status: Normal   Collection Time   04/15/12  7:58 PM      Component Value Range Status Comment   C difficile by pcr NEGATIVE  NEGATIVE Final      Labs: Basic Metabolic Panel:  Lab 04/17/12 1191  04/16/12 0610 04/15/12 0400 04/14/12 0827  NA 139 144 143 141  K 3.2* 3.6 3.1* 4.0  CL 103 109 107 105  CO2 24 25 25  --  GLUCOSE 94 97 95 120*  BUN 16 10 11 18   CREATININE 1.00 0.77 0.69 1.10  CALCIUM 10.4 10.3 9.9 --  MG -- -- -- --  PHOS -- -- -- --   Liver Function Tests:  Lab 04/15/12 0400 04/14/12 1130  AST 53* 31  ALT 28 17  ALKPHOS 86 94  BILITOT 0.2* 0.2*  PROT 6.2 6.8  ALBUMIN 3.3* 3.6   No results found for this basename: LIPASE:5,AMYLASE:5 in the last 168 hours  Lab 04/14/12 1143  AMMONIA 21   CBC:  Lab 04/16/12 0610 04/15/12 0400 04/14/12 1143 04/14/12 0827  WBC 6.4 5.1 10.9* --  NEUTROABS -- -- 8.8* --  HGB 10.8*  10.7* 12.4 13.3  HCT 33.2* 33.3* 38.1 39.0  MCV 89.0 90.0 90.1 --  PLT 156 129* 152 --   Cardiac Enzymes: No results found for this basename: CKTOTAL:5,CKMB:5,CKMBINDEX:5,TROPONINI:5 in the last 168 hours BNP: BNP (last 3 results)  Basename 04/14/12 1501  PROBNP 675.7*   CBG: No results found for this basename: GLUCAP:5 in the last 168 hours     Signed:  Mykeisha Dysert  [facility] 04/17/2012, 9:13 AM

## 2012-04-17 NOTE — Progress Notes (Signed)
DC orders received.  Patient stable with no S/S of distress.  Medication and discharge information reviewed with patient.  Attempted to call report to [facility] section of [facility], but no answer.  Will try again later.  Patient DC via ambulance to SNF. [place], Mitzi Hansen

## 2012-04-20 LAB — CULTURE, BLOOD (ROUTINE X 2): Culture: NO GROWTH

## 2012-08-13 DIAGNOSIS — N039 Chronic nephritic syndrome with unspecified morphologic changes: Secondary | ICD-10-CM

## 2012-08-13 DIAGNOSIS — I509 Heart failure, unspecified: Secondary | ICD-10-CM

## 2012-08-13 DIAGNOSIS — F0390 Unspecified dementia without behavioral disturbance: Secondary | ICD-10-CM

## 2012-08-13 DIAGNOSIS — I130 Hypertensive heart and chronic kidney disease with heart failure and stage 1 through stage 4 chronic kidney disease, or unspecified chronic kidney disease: Secondary | ICD-10-CM

## 2012-08-13 DIAGNOSIS — M159 Polyosteoarthritis, unspecified: Secondary | ICD-10-CM

## 2012-08-13 DIAGNOSIS — J449 Chronic obstructive pulmonary disease, unspecified: Secondary | ICD-10-CM

## 2012-08-13 DIAGNOSIS — F339 Major depressive disorder, recurrent, unspecified: Secondary | ICD-10-CM

## 2012-08-13 DIAGNOSIS — I209 Angina pectoris, unspecified: Secondary | ICD-10-CM

## 2012-08-28 ENCOUNTER — Other Ambulatory Visit: Payer: Self-pay

## 2012-08-28 LAB — CBC WITH DIFFERENTIAL/PLATELET
Basophil #: 0.1 10*3/uL (ref 0.0–0.1)
Basophil %: 0.9 %
Eosinophil #: 0.6 10*3/uL (ref 0.0–0.7)
Eosinophil %: 8.1 %
HCT: 35.5 % (ref 35.0–47.0)
HGB: 11.6 g/dL — ABNORMAL LOW (ref 12.0–16.0)
Lymphocyte #: 1.1 10*3/uL (ref 1.0–3.6)
Lymphocyte %: 14.8 %
MCH: 26.3 pg (ref 26.0–34.0)
MCHC: 32.7 g/dL (ref 32.0–36.0)
MCV: 81 fL (ref 80–100)
Monocyte #: 0.6 x10 3/mm (ref 0.2–0.9)
Monocyte %: 8.4 %
Neutrophil #: 5 10*3/uL (ref 1.4–6.5)
Neutrophil %: 67.8 %
Platelet: 220 10*3/uL (ref 150–440)
RBC: 4.41 10*6/uL (ref 3.80–5.20)
RDW: 15.1 % — ABNORMAL HIGH (ref 11.5–14.5)
WBC: 7.4 10*3/uL (ref 3.6–11.0)

## 2012-08-28 LAB — BASIC METABOLIC PANEL
Anion Gap: 7 (ref 7–16)
Glucose: 116 mg/dL — ABNORMAL HIGH (ref 65–99)

## 2012-08-29 LAB — URINE CULTURE

## 2012-09-02 ENCOUNTER — Encounter (HOSPITAL_COMMUNITY): Payer: Self-pay | Admitting: Emergency Medicine

## 2012-09-02 ENCOUNTER — Emergency Department (HOSPITAL_COMMUNITY)
Admission: EM | Admit: 2012-09-02 | Discharge: 2012-09-02 | Disposition: A | Discharge status: Home / Self Care | Payer: Medicare Other | Attending: Emergency Medicine | Admitting: Emergency Medicine

## 2012-09-02 ENCOUNTER — Emergency Department (HOSPITAL_COMMUNITY): Payer: Medicare Other

## 2012-09-02 DIAGNOSIS — M129 Arthropathy, unspecified: Secondary | ICD-10-CM | POA: Insufficient documentation

## 2012-09-02 DIAGNOSIS — F329 Major depressive disorder, single episode, unspecified: Secondary | ICD-10-CM | POA: Insufficient documentation

## 2012-09-02 DIAGNOSIS — F0390 Unspecified dementia without behavioral disturbance: Secondary | ICD-10-CM | POA: Insufficient documentation

## 2012-09-02 DIAGNOSIS — Z87891 Personal history of nicotine dependence: Secondary | ICD-10-CM | POA: Insufficient documentation

## 2012-09-02 DIAGNOSIS — J45901 Unspecified asthma with (acute) exacerbation: Secondary | ICD-10-CM | POA: Insufficient documentation

## 2012-09-02 DIAGNOSIS — E7800 Pure hypercholesterolemia, unspecified: Secondary | ICD-10-CM | POA: Insufficient documentation

## 2012-09-02 DIAGNOSIS — Z9181 History of falling: Secondary | ICD-10-CM | POA: Insufficient documentation

## 2012-09-02 DIAGNOSIS — Z79899 Other long term (current) drug therapy: Secondary | ICD-10-CM | POA: Insufficient documentation

## 2012-09-02 DIAGNOSIS — R410 Disorientation, unspecified: Secondary | ICD-10-CM

## 2012-09-02 DIAGNOSIS — Z7982 Long term (current) use of aspirin: Secondary | ICD-10-CM | POA: Insufficient documentation

## 2012-09-02 DIAGNOSIS — Z85828 Personal history of other malignant neoplasm of skin: Secondary | ICD-10-CM | POA: Insufficient documentation

## 2012-09-02 DIAGNOSIS — F3289 Other specified depressive episodes: Secondary | ICD-10-CM | POA: Insufficient documentation

## 2012-09-02 DIAGNOSIS — Z792 Long term (current) use of antibiotics: Secondary | ICD-10-CM | POA: Insufficient documentation

## 2012-09-02 DIAGNOSIS — IMO0002 Reserved for concepts with insufficient information to code with codable children: Secondary | ICD-10-CM | POA: Insufficient documentation

## 2012-09-02 DIAGNOSIS — I10 Essential (primary) hypertension: Secondary | ICD-10-CM | POA: Insufficient documentation

## 2012-09-02 DIAGNOSIS — F29 Unspecified psychosis not due to a substance or known physiological condition: Secondary | ICD-10-CM | POA: Insufficient documentation

## 2012-09-02 DIAGNOSIS — J159 Unspecified bacterial pneumonia: Secondary | ICD-10-CM | POA: Insufficient documentation

## 2012-09-02 DIAGNOSIS — Z8701 Personal history of pneumonia (recurrent): Secondary | ICD-10-CM | POA: Insufficient documentation

## 2012-09-02 DIAGNOSIS — E039 Hypothyroidism, unspecified: Secondary | ICD-10-CM | POA: Insufficient documentation

## 2012-09-02 DIAGNOSIS — I252 Old myocardial infarction: Secondary | ICD-10-CM | POA: Insufficient documentation

## 2012-09-02 DIAGNOSIS — J189 Pneumonia, unspecified organism: Secondary | ICD-10-CM

## 2012-09-02 DIAGNOSIS — Z9981 Dependence on supplemental oxygen: Secondary | ICD-10-CM | POA: Insufficient documentation

## 2012-09-02 DIAGNOSIS — Z8673 Personal history of transient ischemic attack (TIA), and cerebral infarction without residual deficits: Secondary | ICD-10-CM | POA: Insufficient documentation

## 2012-09-02 LAB — POCT I-STAT, CHEM 8
Glucose, Bld: 100 mg/dL — ABNORMAL HIGH (ref 70–99)
HCT: 39 % (ref 36.0–46.0)
Hemoglobin: 13.3 g/dL (ref 12.0–15.0)
Potassium: 3.7 mEq/L (ref 3.5–5.1)
Sodium: 140 mEq/L (ref 135–145)
TCO2: 24 mmol/L (ref 0–100)

## 2012-09-02 LAB — COMPREHENSIVE METABOLIC PANEL
AST: 26 U/L (ref 0–37)
BUN: 14 mg/dL (ref 6–23)
CO2: 25 mEq/L (ref 19–32)
Chloride: 100 mEq/L (ref 96–112)
Creatinine, Ser: 0.83 mg/dL (ref 0.50–1.10)
GFR calc non Af Amer: 63 mL/min — ABNORMAL LOW (ref >90)
Total Bilirubin: 0.4 mg/dL (ref 0.3–1.2)

## 2012-09-02 LAB — CBC WITH DIFFERENTIAL/PLATELET
HCT: 37.5 % (ref 36.0–46.0)
Hemoglobin: 12 g/dL (ref 12.0–15.0)
Lymphocytes Relative: 15 % (ref 12–46)
Monocytes Absolute: 0.8 10*3/uL (ref 0.1–1.0)
Monocytes Relative: 10 % (ref 3–12)
Neutro Abs: 5.8 10*3/uL (ref 1.7–7.7)
WBC: 8 10*3/uL (ref 4.0–10.5)

## 2012-09-02 LAB — POCT I-STAT TROPONIN I

## 2012-09-02 MED ORDER — SODIUM CHLORIDE 0.9 % IV BOLUS (SEPSIS): 1000.0000 mL | Freq: Once | INTRAVENOUS | Status: DC

## 2012-09-02 NOTE — ED Notes (Addendum)
Pt BIB EMS from [facility] for increased confusion since Friday. Pt hx dementia and mental health. Per EMS pt was having hallucinations last night and combative. Pt given Haldol last night and this AM by SNF. U/A, CXR done on 6/23 and showed pneumonia. Pt started abx therapy yesterday. Staff reported to EMS that pt was at baseline at the time of transport.

## 2012-09-02 NOTE — ED Notes (Signed)
Pt son at bedside, reports Monday he noticed a change in pts dementia and confusion. He reports prior to SNF admission pt was having psychotic episodes at home. He continues to report pt baseline is oriented to person and sometimes oriented to place. Pt resting with eyes closed at this time.

## 2012-09-02 NOTE — ED Notes (Signed)
Lactic acid results shown to Dr. Yao 

## 2012-09-02 NOTE — ED Notes (Signed)
Called [facility] for transport back home  

## 2012-09-02 NOTE — ED Provider Notes (Signed)
History    CSN: [id number] Arrival date & time 09/02/12  0930  First MD Initiated Contact with Patient 09/02/12 [phone]     Chief Complaint  Patient presents with  . Altered Mental Status   (Consider location/radiation/quality/duration/timing/severity/associated sxs/prior Treatment) The history is provided by the patient, the EMS personnel, the nursing home and a relative.  [name] is a 79 y.o. female history of dementia, hypertension, stroke, recent pneumonia here presenting with agitation. She recently had normal blood work and urine culture in the nursing home and had a chest x-ray that showed possible left lower lobe pneumonia. She was started on antibiotics recently and yesterday was confused and agitated. As per the son and the nursing home she was walking around very confused. At baseline she is in a wheelchair constantly. She required several doses of haldol and is sent from nursing home for evaluation.    Level V caveat- dementia  Past Medical History  Diagnosis Date  . Stroke   . Arthritis   . Dementia   . Pneumonia   . Multiple falls   . Myocardial infarction   . Anginal pain     "once in awhile" (01/15/2012)  . Asthma   . Shortness of breath     "any time" (01/15/2012)  . Hypothyroidism   . Hypercholesteremia   . Hypertension   . Skin cancer     "arms, face, maybe on my right big toe" (01/15/2012)  . Dizzinesses   . On home oxygen therapy   . Depression    Past Surgical History  Procedure Laterality Date  . Tonsillectomy  ~ 1946  . Cataract extraction w/ intraocular lens  implant, bilateral    . Skin cancer excision      "arms, face" (01/15/2012)  . Tumor excision      "cut off 5th vertebra" (01/15/2012)  . Tympanoplasty      right ear   No family history on file. History  Substance Use Topics  . Smoking status: Former Smoker -- 1.00 packs/day for 55 years    Types: Cigarettes  . Smokeless tobacco: Never Used     Comment: 01/15/2012 "stopped  smoking ~ 12 yr ago"  . Alcohol Use: Yes     Comment: 01/15/2012 "stopped all alcohol 30 some years ago"   OB History   Grav Para Term Preterm Abortions TAB SAB Ect Mult Living                 Review of Systems  Unable to perform ROS: Mental status change    Allergies  Other  Home Medications   Current Outpatient Rx  Name  Route  Sig  Dispense  Refill  . acetaminophen (TYLENOL) 325 MG tablet   Oral   Take 2 tablets (650 mg total) by mouth every 6 (six) hours as needed for pain (or Fever >/= 101).         Marland Kitchen albuterol (PROVENTIL) (2.5 MG/3ML) 0.083% nebulizer solution   Nebulization   Take 2.5 mg by nebulization every 4 (four) hours as needed. For shortness of breath         . allopurinol (ZYLOPRIM) 100 MG tablet   Oral   Take 100 mg by mouth daily.         Marland Kitchen aspirin 325 MG tablet   Oral   Take 325 mg by mouth daily.         . Cholecalciferol (VITAMIN D PO)   Oral   Take 1 tablet  by mouth daily.         . diazepam (VALIUM) 5 MG tablet   Oral   Take 1 tablet (5 mg total) by mouth every 12 (twelve) hours as needed. For anxiety   30 tablet   0   . donepezil (ARICEPT) 5 MG tablet   Oral   Take 5 mg by mouth at bedtime.         Marland Kitchen FLUoxetine (PROZAC) 20 MG capsule   Oral   Take 20 mg by mouth daily.         . fluticasone (FLONASE) 50 MCG/ACT nasal spray   Nasal   Place 2 sprays into the nose daily.         . furosemide (LASIX) 40 MG tablet   Oral   Take 40 mg by mouth daily.         Marland Kitchen gabapentin (NEURONTIN) 300 MG capsule   Oral   Take 300-600 mg by mouth 2 (two) times daily. 1 cap in AM and 2 in PM         . guaiFENesin 200 MG tablet   Oral   Take 1 tablet (200 mg total) by mouth every 8 (eight) hours.   30 suppository   0   . HYDROcodone-acetaminophen (NORCO) 7.5-325 MG per tablet   Oral   Take 1 tablet by mouth every 4 (four) hours as needed. For pain   30 tablet   0   . levofloxacin (LEVAQUIN) 500 MG tablet   Oral   Take 1  tablet (500 mg total) by mouth daily.   6 tablet   0   . levothyroxine (SYNTHROID, LEVOTHROID) 50 MCG tablet   Oral   Take 50 mcg by mouth daily.         . metoprolol tartrate (LOPRESSOR) 25 MG tablet   Oral   Take 25 mg by mouth daily.         . potassium chloride SA (K-DUR,KLOR-CON) 20 MEQ tablet   Oral   Take 2 tablets (40 mEq total) by mouth daily.   10 tablet   0   . risperiDONE (RISPERDAL) 1 MG tablet   Oral   Take 1 mg by mouth at bedtime and may repeat dose one time if needed.         . simvastatin (ZOCOR) 10 MG tablet   Oral   Take 10 mg by mouth at bedtime.         Marland Kitchen tiotropium (SPIRIVA) 18 MCG inhalation capsule   Inhalation   Place 18 mcg into inhaler and inhale daily.          BP 143/53  Pulse 84  Temp(Src) 97.5 F (36.4 C) (Oral)  Resp 19  SpO2 97% Physical Exam  Nursing note and vitals reviewed. Constitutional:  Chronically ill, NAD, demented, not agitated   HENT:  Head: Normocephalic.  Mouth/Throat: Oropharynx is clear and moist.  Eyes: Conjunctivae are normal. Pupils are equal, round, and reactive to light.  Neck: Normal range of motion. Neck supple.  Cardiovascular: Normal rate, regular rhythm and normal heart sounds.   Pulmonary/Chest: Effort normal.  Diminished breath sounds bilateral bases. No crackles   Abdominal: Soft. Bowel sounds are normal. She exhibits no distension. There is no tenderness. There is no rebound and no guarding.  Musculoskeletal: Normal range of motion. She exhibits no edema and no tenderness.  Neurological: She is alert.  Demented, moving all extremities   Skin: Skin is warm and dry.  Psychiatric:  She has a normal mood and affect. Her behavior is normal. Judgment and thought content normal.    ED Course  Procedures (including critical care time) Labs Reviewed  COMPREHENSIVE METABOLIC PANEL - Abnormal; Notable for the following:    Glucose, Bld 101 (*)    Calcium 10.8 (*)    Alkaline Phosphatase 147 (*)     GFR calc non Af Amer 63 (*)    GFR calc Af Amer 73 (*)    All other components within normal limits  POCT I-STAT, CHEM 8 - Abnormal; Notable for the following:    Glucose, Bld 100 (*)    Calcium, Ion 1.31 (*)    All other components within normal limits  CG4 I-STAT (LACTIC ACID) - Abnormal; Notable for the following:    Lactic Acid, Venous 3.43 (*)    All other components within normal limits  CBC WITH DIFFERENTIAL  LACTIC ACID, PLASMA  POCT I-STAT TROPONIN I   Dg Chest 2 View  09/02/2012   *RADIOLOGY REPORT*  Clinical Data: Altered mental status.  History of pneumonia.  CHEST - 2 VIEW  Comparison: 04/14/2012 and 01/15/2012.  Findings: The heart size and mediastinal contours are stable. There is chronic aortic atherosclerosis.  There is chronic elevation of the right hemidiaphragm.  Mildly increased air space disease is present in the left lower lobe.  There is no significant pleural effusion.  Calcified hepatic granuloma is noted.  There is a convex right thoracolumbar scoliosis.  IMPRESSION: Increased patchy left lower lobe atelectasis or infiltrate.  No other significant changes.   Original Report Authenticated By: Carey Bullocks, M.D.   Ct Head Wo Contrast  09/02/2012   *RADIOLOGY REPORT*  Clinical Data: Altered mental status, history of dementia  CT HEAD WITHOUT CONTRAST  Technique:  Contiguous axial images were obtained from the base of the skull through the vertex without contrast.  Comparison: Prior MRI and CT from 04/14/2012.  Findings: There is no acute intracranial hemorrhage or infarct. Diffuse prominence of the CSF containing spaces consistent with generalized atrophy, unchanged. Vessel ischemic changes are again noted.  There is no midline shift or mass lesion.  Focus of soft tissue swelling is noted over the left parietal scalp ( series 2, image 24). No skull fracture is identified.  IMPRESSION: 1.  Stable appearance of the brain with no acute intracranial abnormality.  2. Left  parietal scalp contusion.   Original Report Authenticated By: Rise Mu, M.D.   No diagnosis found.   Date: 09/02/2012  Rate: 61  Rhythm: normal sinus rhythm  QRS Axis: normal  Intervals: normal  ST/T Wave abnormalities: nonspecific ST changes  Conduction Disutrbances:none  Narrative Interpretation:   Old EKG Reviewed: unchanged    MDM  TACIA HINDLEY is a 79 y.o. female here with agitation. Likely delirium with dementia. Will do sepsis workup. Will get CT head given frequent falls. Vitals stable and doesn't meet SIRS criteria. Will reassess.   2:32 PM Labs stable. CT head showed L parietal scalp contusion with no bleed. Istat lactate elevated but lab lactate nl. Likely delirium with dementia. Recommend continue levaquin as prescribed. She needs to be on fall precautions.    Richardean Canal, MD 09/02/12 [phone]

## 2012-09-02 NOTE — ED Notes (Signed)
NAD noted at time of d/c inst with son. [facility] to transport back to SNF

## 2012-09-02 NOTE — ED Notes (Addendum)
Pt in CT at this time. Pt complaints earlier "my butt is sore". Reddened area noted to sacral area.

## 2012-10-09 ENCOUNTER — Encounter: Payer: Self-pay | Admitting: *Deleted

## 2012-10-16 ENCOUNTER — Non-Acute Institutional Stay (SKILLED_NURSING_FACILITY): Payer: PRIVATE HEALTH INSURANCE | Admitting: Internal Medicine

## 2012-10-16 DIAGNOSIS — G4700 Insomnia, unspecified: Secondary | ICD-10-CM

## 2012-10-16 DIAGNOSIS — IMO0002 Reserved for concepts with insufficient information to code with codable children: Secondary | ICD-10-CM

## 2012-10-16 DIAGNOSIS — F3110 Bipolar disorder, current episode manic without psychotic features, unspecified: Secondary | ICD-10-CM

## 2012-10-16 DIAGNOSIS — N3281 Overactive bladder: Secondary | ICD-10-CM

## 2012-10-16 DIAGNOSIS — F0391 Unspecified dementia with behavioral disturbance: Secondary | ICD-10-CM

## 2012-10-16 DIAGNOSIS — I10 Essential (primary) hypertension: Secondary | ICD-10-CM

## 2012-10-16 DIAGNOSIS — M109 Gout, unspecified: Secondary | ICD-10-CM

## 2012-10-16 DIAGNOSIS — E039 Hypothyroidism, unspecified: Secondary | ICD-10-CM

## 2012-10-16 DIAGNOSIS — K219 Gastro-esophageal reflux disease without esophagitis: Secondary | ICD-10-CM

## 2012-10-16 DIAGNOSIS — N318 Other neuromuscular dysfunction of bladder: Secondary | ICD-10-CM

## 2012-10-16 DIAGNOSIS — F0390 Unspecified dementia without behavioral disturbance: Secondary | ICD-10-CM | POA: Insufficient documentation

## 2012-10-16 DIAGNOSIS — F03918 Unspecified dementia, unspecified severity, with other behavioral disturbance: Secondary | ICD-10-CM

## 2012-10-16 NOTE — Progress Notes (Signed)
Patient ID: [name], female   DOB: 1926-11-01, 79 y.o.   MRN: [id number]  [facility]  Chief Complaint  Patient presents with  . Medical Managment of Chronic Issues   Allergies  Allergen Reactions  . Other Other (See Comments)    Reaction unknown: Mercurocrome.   hpi 79 y/o patient seen today for routine visit. She complaints of increased urinary frequency. Denies any other complaints. She has been sleeping well with melatonin. No other complaints from pt or staff  Review of Systems  Constitutional: Negative for fever and chills.  HENT: Negative for congestion.   Eyes: Negative for blurred vision.  Respiratory: Negative for cough and shortness of breath.   Cardiovascular: Negative for chest pain and palpitations.  Gastrointestinal: Negative for abdominal pain.  Musculoskeletal: Negative for falls.  Neurological: Negative for dizziness, seizures and headaches.   Medication reviewed. See [facility]  Past Medical History  Diagnosis Date  . Stroke   . Arthritis   . Dementia   . Pneumonia   . Multiple falls   . Myocardial infarction   . Anginal pain     "once in awhile" (01/15/2012)  . Asthma   . Shortness of breath     "any time" (01/15/2012)  . Hypothyroidism   . Hypercholesteremia   . Hypertension   . Skin cancer     "arms, face, maybe on my right big toe" (01/15/2012)  . Dizzinesses   . On home oxygen therapy   . Depression   . COPD (chronic obstructive pulmonary disease)   . CAD (coronary artery disease)   . Depressive disorder, not elsewhere classified   . Allergic rhinitis   . Diastolic CHF   . Gout   . Other and unspecified hyperlipidemia   . PVD (peripheral vascular disease)   . RLS (restless legs syndrome)   . Osteoarthritis   . GERD (gastroesophageal reflux disease)   . Benign hypertensive heart disease with heart failure(402.11)   . Senile dementia, uncomplicated   . Major depressive disorder, recurrent episode, unspecified     BP 133/59  Pulse 61  Temp(Src) 96.4 F (35.8 C)  Resp 18  SpO2 96%  Physical Exam  Constitutional: She appears well-developed and well-nourished. No distress.  HENT:  Head: Normocephalic and atraumatic.  Mouth/Throat: Oropharynx is clear and moist. No oropharyngeal exudate.  Eyes: Conjunctivae and EOM are normal. Pupils are equal, round, and reactive to light.  Neck: Normal range of motion. Neck supple.  Cardiovascular: Normal rate and regular rhythm.   Pulmonary/Chest: Effort normal and breath sounds normal. No respiratory distress. She exhibits no tenderness.  Abdominal: Soft. Bowel sounds are normal. She exhibits no mass.  Musculoskeletal: Normal range of motion. She exhibits no edema.  Neurological: She is alert.  Skin: Skin is warm and dry. She is not diaphoretic.  Psychiatric: She has a normal mood and affect.    Labs reviewed  Assessment/plan  OAB Will start her on oxybutynin 5 mg daily and reassess her increased urinary frequency  Bipolar disorder Continue depakote for now with risperidal. Also on prn valium  Senile dementia Continue aricept for now  Insomnia Melatonin has been of some help, her OAB could also be contributing to interrupted sleep. Will reassess with trial of oxybutynin and melatonin  gerd Continue prilosec  Neuropathic pain Continue neurontin for now and reassess  Gout No recent acute flare, continue allopurinol and monitor uric acid level  Hypothyroidism Continue levothyroxine for now, monitor tsh  htn  Continue lasix and lopressor, monitor bmp and bp

## 2012-10-19 ENCOUNTER — Encounter: Payer: Self-pay | Admitting: *Deleted

## 2012-11-12 ENCOUNTER — Other Ambulatory Visit: Payer: Self-pay | Admitting: *Deleted

## 2012-11-12 MED ORDER — HYDROCODONE-ACETAMINOPHEN 7.5-325 MG PO TABS: 1.0000 | ORAL_TABLET | ORAL | Status: AC | PRN

## 2012-11-27 ENCOUNTER — Non-Acute Institutional Stay (SKILLED_NURSING_FACILITY): Payer: PRIVATE HEALTH INSURANCE | Admitting: Internal Medicine

## 2012-11-27 DIAGNOSIS — N318 Other neuromuscular dysfunction of bladder: Secondary | ICD-10-CM

## 2012-11-27 DIAGNOSIS — N3281 Overactive bladder: Secondary | ICD-10-CM

## 2012-11-27 DIAGNOSIS — F3110 Bipolar disorder, current episode manic without psychotic features, unspecified: Secondary | ICD-10-CM

## 2012-11-27 DIAGNOSIS — M109 Gout, unspecified: Secondary | ICD-10-CM

## 2012-11-27 DIAGNOSIS — M1710 Unilateral primary osteoarthritis, unspecified knee: Secondary | ICD-10-CM | POA: Insufficient documentation

## 2012-11-27 DIAGNOSIS — G2581 Restless legs syndrome: Secondary | ICD-10-CM | POA: Insufficient documentation

## 2012-11-27 DIAGNOSIS — K219 Gastro-esophageal reflux disease without esophagitis: Secondary | ICD-10-CM

## 2012-11-27 DIAGNOSIS — E039 Hypothyroidism, unspecified: Secondary | ICD-10-CM

## 2012-11-27 DIAGNOSIS — F0280 Dementia in other diseases classified elsewhere without behavioral disturbance: Secondary | ICD-10-CM

## 2012-11-27 NOTE — Progress Notes (Signed)
Patient ID: [name], female   DOB: Jul 11, 1926, 79 y.o.   MRN: [id number]  [facility]  Code status - dnr  Allergies  Allergen Reactions  . Other Other (See Comments)    Reaction unknown: Mercurocrome.   Chief Complaint  Patient presents with  . Medical Managment of Chronic Issues   HPI 79 y/o patient seen today for routine visit. Her siste and son are present during the viist. She is in no distress. She complaints of the pain in her right knee area and lower back bothering her more recently. Denies any other complaints. No concerns from staff  Review of Systems  Constitutional: Negative for fever and chills.  HENT: Negative for congestion.   Eyes: Negative for blurred vision.  Respiratory: Negative for cough and shortness of breath.   Cardiovascular: Negative for chest pain and palpitations.  Gastrointestinal: Negative for abdominal pain.  Musculoskeletal: Negative for falls.  Neurological: Negative for dizziness, seizures and headaches.   Past Medical History  Diagnosis Date  . Stroke   . Arthritis   . Dementia   . Pneumonia   . Multiple falls   . Myocardial infarction   . Anginal pain     "once in awhile" (01/15/2012)  . Asthma   . Shortness of breath     "any time" (01/15/2012)  . Hypothyroidism   . Hypercholesteremia   . Hypertension   . Skin cancer     "arms, face, maybe on my right big toe" (01/15/2012)  . Dizzinesses   . On home oxygen therapy   . Depression   . COPD (chronic obstructive pulmonary disease)   . CAD (coronary artery disease)   . Depressive disorder, not elsewhere classified   . Allergic rhinitis   . Diastolic CHF   . Gout   . Other and unspecified hyperlipidemia   . PVD (peripheral vascular disease)   . RLS (restless legs syndrome)   . Osteoarthritis   . GERD (gastroesophageal reflux disease)   . Benign hypertensive heart disease with heart failure(402.11)   . Senile dementia, uncomplicated   . Major depressive  disorder, recurrent episode, unspecified    Current Outpatient Prescriptions on File Prior to Visit  Medication Sig Dispense Refill  . acetaminophen (TYLENOL) 325 MG tablet Take 2 tablets (650 mg total) by mouth every 6 (six) hours as needed for pain (or Fever >/= 101).      . Aclidinium Bromide (TUDORZA PRESSAIR) 400 MCG/ACT AEPB Inhale 400 mcg into the lungs 2 (two) times daily. Use 1 inhalation twice daily for asthma      . albuterol (PROVENTIL) (2.5 MG/3ML) 0.083% nebulizer solution Take 2.5 mg by nebulization every 4 (four) hours as needed. For shortness of breath      . allopurinol (ZYLOPRIM) 100 MG tablet Take 100 mg by mouth daily.      . Cholecalciferol (VITAMIN D PO) Take 1 tablet by mouth daily.      . diazepam (VALIUM) 5 MG tablet Take 1 tablet (5 mg total) by mouth every 12 (twelve) hours as needed. For anxiety  30 tablet  0  . donepezil (ARICEPT) 5 MG tablet Take 10 mg by mouth at bedtime. Take 10 mg at bedtime for dementia.      . fluticasone (FLONASE) 50 MCG/ACT nasal spray Place 1 spray into the nose daily. Use 1 spray per nostril daily for stuffiness/ congestion.      . furosemide (LASIX) 40 MG tablet Take 20 mg by mouth daily.       Marland Kitchen  gabapentin (NEURONTIN) 300 MG capsule Take 300-600 mg by mouth 3 (three) times daily. Take 1 capsule by mouth three times daily for neuropathic pain.      Marland Kitchen guaiFENesin 200 MG tablet Take 1 tablet (200 mg total) by mouth every 8 (eight) hours.  30 suppository  0  . HYDROcodone-acetaminophen (NORCO) 7.5-325 MG per tablet Take 1 tablet by mouth every 4 (four) hours as needed. For pain  180 tablet  0  . levothyroxine (SYNTHROID, LEVOTHROID) 50 MCG tablet Take 50 mcg by mouth daily. Take 1 tablet by mouth daily for hypothyroidism, Check pulse weekly.      . metoprolol tartrate (LOPRESSOR) 25 MG tablet Take 25 mg by mouth daily.      . potassium chloride SA (K-DUR,KLOR-CON) 20 MEQ tablet Take 20 mEq by mouth daily. Take 1 tablet by mouth for supplement.       . risperiDONE (RISPERDAL) 1 MG tablet Take 1 mg by mouth at bedtime and may repeat dose one time if needed. Take 1 tablet by mouth every night at bedtime for psychosis.      Marland Kitchen simvastatin (ZOCOR) 10 MG tablet Take 10 mg by mouth at bedtime. Take 1 tablet by mouth every night at bedtime for hypercholesteremia.       No current facility-administered medications on file prior to visit.   Past Surgical History  Procedure Laterality Date  . Tonsillectomy  ~ 1946  . Cataract extraction w/ intraocular lens  implant, bilateral    . Skin cancer excision      "arms, face" (01/15/2012)  . Tumor excision      "cut off 5th vertebra" (01/15/2012)  . Tympanoplasty      right ear    Physical Exam   BP 160/61  Pulse 69  Temp(Src) 97.8 F (36.6 C)  Resp 18  SpO2 98%  Constitutional: She appears well-developed and well-nourished. No distress.  HEENT:   Head: Normocephalic and atraumatic.   Mouth/Throat: Oropharynx is clear and moist. No oropharyngeal exudate.  Eyes: Conjunctivae and EOM are normal. Pupils are equal, round, and reactive to light.  Neck: Normal range of motion. Neck supple.  Cardiovascular: Normal rate and regular rhythm.   Pulmonary/Chest: Effort normal and breath sounds normal. No respiratory distress. She exhibits no tenderness.  Abdominal: Soft. Bowel sounds are normal. She exhibits no mass.  Musculoskeletal: Normal range of motion in her knee joint, on a wheelchair. She exhibits no edema. No spinal tenderness. No paraspinal tenderness Neurological: She is alert.  Skin: Skin is warm and dry. She is not diaphoretic.  Psychiatric: She has a normal mood and affect.   Labs reviewed 08/28/12 wbc 7.4, hb 11.6, hct 35.5, plt 220, na 135, k 4.1, cl 102, bun 18, cr 0.87, glu 116, ca 9.9   Assessment/plan  Copd Breathing stable this visit. Continue tudorza and prn albuterol  Generalized osteoarthrosis Persists. Currently on norco prn and tylenol. Continue vitamin d. Will  have her on tramadol 50 mg q12h for now with this  OAB Will start her on oxybutynin 5 mg daily and reassess   Bipolar disorder Continue depakote for now with risperidal. Also on prn valium. Mood remains stable  alzhimer's dementia Continue aricept for now  Allergic rhinitis Continue flonase  Restless leg syndrome Continue neurontin 300 mg tid and requip, has been helpful  gerd Continue prilosec  Gout No recent acute flare, continue allopurinol and monitor uric acid level  Hypothyroidism Continue levothyroxine for now, monitor tsh

## 2012-11-30 ENCOUNTER — Other Ambulatory Visit: Payer: Self-pay | Admitting: *Deleted

## 2012-11-30 MED ORDER — TRAMADOL HCL 50 MG PO TABS: ORAL_TABLET | ORAL | Status: AC

## 2012-12-21 ENCOUNTER — Other Ambulatory Visit: Payer: Self-pay | Admitting: *Deleted

## 2012-12-21 MED ORDER — DIAZEPAM 5 MG PO TABS: ORAL_TABLET | ORAL | Status: AC

## 2012-12-28 ENCOUNTER — Encounter: Payer: Self-pay | Admitting: Internal Medicine

## 2012-12-28 ENCOUNTER — Non-Acute Institutional Stay (SKILLED_NURSING_FACILITY): Payer: PRIVATE HEALTH INSURANCE | Admitting: Internal Medicine

## 2012-12-28 DIAGNOSIS — E039 Hypothyroidism, unspecified: Secondary | ICD-10-CM

## 2012-12-28 DIAGNOSIS — G2581 Restless legs syndrome: Secondary | ICD-10-CM

## 2012-12-28 DIAGNOSIS — IMO0002 Reserved for concepts with insufficient information to code with codable children: Secondary | ICD-10-CM

## 2012-12-28 DIAGNOSIS — N3281 Overactive bladder: Secondary | ICD-10-CM

## 2012-12-28 DIAGNOSIS — J309 Allergic rhinitis, unspecified: Secondary | ICD-10-CM

## 2012-12-28 DIAGNOSIS — F0280 Dementia in other diseases classified elsewhere without behavioral disturbance: Secondary | ICD-10-CM

## 2012-12-28 DIAGNOSIS — F3110 Bipolar disorder, current episode manic without psychotic features, unspecified: Secondary | ICD-10-CM

## 2012-12-28 DIAGNOSIS — N318 Other neuromuscular dysfunction of bladder: Secondary | ICD-10-CM

## 2012-12-28 DIAGNOSIS — M1710 Unilateral primary osteoarthritis, unspecified knee: Secondary | ICD-10-CM

## 2012-12-28 NOTE — Progress Notes (Signed)
Patient ID: [name], female   DOB: 1926/05/21, 79 y.o.   MRN: [id number]  [facility]  Code status - dnr  Allergies  Allergen Reactions  . Other Other (See Comments)    Reaction unknown: Mercurocrome.   Chief Complaint  Patient presents with  . Medical Managment of Chronic Issues   HPI 79 y/o patient seen today for routine visit. Denies any complaints. No concerns from staff. No new skin concerns, no falls, weight has been stable  Review of Systems   Constitutional: Negative for fever and chills.   HENT: Negative for congestion.    Eyes: Negative for blurred vision.   Respiratory: Negative for cough and shortness of breath.    Cardiovascular: Negative for chest pain and palpitations.   Gastrointestinal: Negative for abdominal pain.   Musculoskeletal: Negative for falls.   Neurological: Negative for dizziness, seizures and headaches.   Past Medical History  Diagnosis Date  . Stroke   . Arthritis   . Dementia   . Pneumonia   . Multiple falls   . Myocardial infarction   . Anginal pain     "once in awhile" (01/15/2012)  . Asthma   . Shortness of breath     "any time" (01/15/2012)  . Hypothyroidism   . Hypercholesteremia   . Hypertension   . Skin cancer     "arms, face, maybe on my right big toe" (01/15/2012)  . Dizzinesses   . On home oxygen therapy   . Depression   . COPD (chronic obstructive pulmonary disease)   . CAD (coronary artery disease)   . Depressive disorder, not elsewhere classified   . Allergic rhinitis   . Diastolic CHF   . Gout   . Other and unspecified hyperlipidemia   . PVD (peripheral vascular disease)   . RLS (restless legs syndrome)   . Osteoarthritis   . GERD (gastroesophageal reflux disease)   . Benign hypertensive heart disease with heart failure(402.11)   . Senile dementia, uncomplicated   . Major depressive disorder, recurrent episode, unspecified    Past Surgical History  Procedure Laterality Date  . Tonsillectomy   ~ 1946  . Cataract extraction w/ intraocular lens  implant, bilateral    . Skin cancer excision      "arms, face" (01/15/2012)  . Tumor excision      "cut off 5th vertebra" (01/15/2012)  . Tympanoplasty      right ear   Medication reviewed. See [facility]   Physical exam  BP 118/71  Pulse 68  Temp(Src) 97.6 F (36.4 C)  Resp 18  SpO2 96%  Constitutional: She appears well-developed and well-nourished. No distress.   HEENT:   Head: Normocephalic and atraumatic.   Mouth/Throat: Oropharynx is clear and moist. No oropharyngeal exudate.   Eyes: Conjunctivae and EOM are normal. Pupils are equal, round, and reactive to light.   Neck: Normal range of motion. Neck supple.   Cardiovascular: Normal rate and regular rhythm.    Pulmonary/Chest: Effort normal and breath sounds normal. No respiratory distress. She exhibits no tenderness.   Abdominal: Soft. Bowel sounds are normal. She exhibits no mass.  Musculoskeletal: Normal range of motion in her knee joint, on a wheelchair. She exhibits no edema. No spinal tenderness. No paraspinal tenderness Neurological: She is alert.   Skin: Skin is warm and dry. She is not diaphoretic.  Psychiatric: She has a normal mood and affect.   Labs reviewed 08/28/12 wbc 7.4, hb 11.6, hct 35.5, plt 220, na  135, k 4.1, cl 102, bun 18, cr 0.87, glu 116, ca 9.9 11/02/12 wbc 7.4, hb 10.7, hct 34.4, plt 205, na 132, k 4.4, bun 15, cr 0.6, tsh 2.3, lipid panel wnl, lft wnl   Assessment/plan  Generalized osteoarthrosis improved with tramadol bid , prn norco. Continue vitamin d supplement   OAB continue oxybutynin 5 mg daily for now  Copd Breathing stable this visit. Continue tudorza and prn albuterol  Insomnia On trazodone and tolerating this well, continue  Restless leg syndrome Continue neurontin 300 mg tid and requip, has been helpful  Bipolar disorder Continue depakote for now with risperidal and prn valium. Mood remains stable  alzhimer's  dementia Continue aricept for now  Allergic rhinitis Stable. Will stop flonase  gerd Continue prilosec  Hypothyroidism Continue levothyroxine for now, monitor tsh periodically

## 2013-02-11 ENCOUNTER — Non-Acute Institutional Stay (SKILLED_NURSING_FACILITY): Payer: PRIVATE HEALTH INSURANCE | Admitting: Internal Medicine

## 2013-02-11 DIAGNOSIS — F0280 Dementia in other diseases classified elsewhere without behavioral disturbance: Secondary | ICD-10-CM

## 2013-02-11 DIAGNOSIS — IMO0002 Reserved for concepts with insufficient information to code with codable children: Secondary | ICD-10-CM

## 2013-02-11 DIAGNOSIS — N3281 Overactive bladder: Secondary | ICD-10-CM

## 2013-02-11 DIAGNOSIS — G2581 Restless legs syndrome: Secondary | ICD-10-CM

## 2013-02-11 DIAGNOSIS — N318 Other neuromuscular dysfunction of bladder: Secondary | ICD-10-CM

## 2013-02-11 DIAGNOSIS — K219 Gastro-esophageal reflux disease without esophagitis: Secondary | ICD-10-CM

## 2013-02-11 DIAGNOSIS — E039 Hypothyroidism, unspecified: Secondary | ICD-10-CM

## 2013-02-11 DIAGNOSIS — M1710 Unilateral primary osteoarthritis, unspecified knee: Secondary | ICD-10-CM

## 2013-02-11 NOTE — Progress Notes (Signed)
Patient ID: [name], female   DOB: 07-05-1926, 79 y.o.   MRN: [id number]  [facility]  Code status - dnr  Allergies reviewed  HPI 79 y/o patient seen today for routine visit. Denies any complaints. She has been refusing all restorative activities. No other concerns from staff. No new skin concerns, no falls, weight has been stable. She is alert and oriented. No new behavioral issues except for her anxiety which is under control.  Review of Systems   Constitutional: Negative for fever and chills.   HENT: Negative for congestion.    Eyes: Negative for blurred vision.   Respiratory: Negative for cough and shortness of breath.    Cardiovascular: Negative for chest pain and palpitations.   Gastrointestinal: Negative for abdominal pain.   Musculoskeletal: Negative for falls.   Neurological: Negative for dizziness, seizures and headaches  Past Medical History  Diagnosis Date  . Stroke   . Arthritis   . Dementia   . Pneumonia   . Multiple falls   . Myocardial infarction   . Anginal pain     "once in awhile" (01/15/2012)  . Asthma   . Shortness of breath     "any time" (01/15/2012)  . Hypothyroidism   . Hypercholesteremia   . Hypertension   . Skin cancer     "arms, face, maybe on my right big toe" (01/15/2012)  . Dizzinesses   . On home oxygen therapy   . Depression   . COPD (chronic obstructive pulmonary disease)   . CAD (coronary artery disease)   . Depressive disorder, not elsewhere classified   . Allergic rhinitis   . Diastolic CHF   . Gout   . Other and unspecified hyperlipidemia   . PVD (peripheral vascular disease)   . RLS (restless legs syndrome)   . Osteoarthritis   . GERD (gastroesophageal reflux disease)   . Benign hypertensive heart disease with heart failure(402.11)   . Senile dementia, uncomplicated   . Major depressive disorder, recurrent episode, unspecified    Past Surgical History  Procedure Laterality Date  . Tonsillectomy  ~ 1946   . Cataract extraction w/ intraocular lens  implant, bilateral    . Skin cancer excision      "arms, face" (01/15/2012)  . Tumor excision      "cut off 5th vertebra" (01/15/2012)  . Tympanoplasty      right ear   Medication reviewed. See [facility]  Physical exam  BP 132/64  Pulse 82  Temp(Src) 97.4 F (36.3 C)  Resp 18  SpO2 97%  Constitutional: She appears well-developed and well-nourished. No distress.   HEENT:   Head: Normocephalic and atraumatic.   Mouth/Throat: Oropharynx is clear and moist. No oropharyngeal exudate.   Eyes: Conjunctivae and EOM are normal. Pupils are equal, round, and reactive to light.   Neck: Normal range of motion. Neck supple.   Cardiovascular: Normal rate and regular rhythm.    Pulmonary/Chest: Effort normal and breath sounds normal. No respiratory distress. She exhibits no tenderness.   Abdominal: Soft. Bowel sounds are normal. She exhibits no mass.  Musculoskeletal: Normal range of motion in her knee joint, on a wheelchair. She exhibits no edema. No spinal tenderness. No paraspinal tenderness Neurological: She is alert.   Skin: Skin is warm and dry. She is not diaphoretic.  Psychiatric: She has a normal mood and affect.   Labs reviewed 08/28/12 wbc 7.4, hb 11.6, hct 35.5, plt 220, na 135, k 4.1, cl 102, bun 18,  cr 0.87, glu 116, ca 9.9 11/02/12 wbc 7.4, hb 10.7, hct 34.4, plt 205, na 132, k 4.4, bun 15, cr 0.6, tsh 2.3, lipid panel wnl, lft wnl   Assessment/plan  Generalized osteoarthrosis improved with tramadol bid , prn norco. Continue vitamin d supplement   OAB continue oxybutynin 5 mg daily for now  alzhimer's dementia Continue aricept for now  Allergic rhinitis Stable. Will stop flonase  gerd Continue prilosec  Hypothyroidism Continue levothyroxine for now, monitor tsh periodically  Copd Breathing stable this visit. Continue tudorza and prn albuterol  Insomnia On trazodone and tolerating this well, continue  Restless leg  syndrome Continue neurontin 300 mg tid and requip, has been helpful  Bipolar disorder Continue depakote for now with risperidal and prn valium. Mood remains stable  Gout No acute attack. Continue allopurinol 100 mg daily

## 2013-04-09 ENCOUNTER — Non-Acute Institutional Stay (SKILLED_NURSING_FACILITY): Payer: PRIVATE HEALTH INSURANCE | Admitting: Internal Medicine

## 2013-04-09 ENCOUNTER — Encounter: Payer: Self-pay | Admitting: Internal Medicine

## 2013-04-09 DIAGNOSIS — M1990 Unspecified osteoarthritis, unspecified site: Secondary | ICD-10-CM

## 2013-04-09 DIAGNOSIS — M109 Gout, unspecified: Secondary | ICD-10-CM

## 2013-04-09 DIAGNOSIS — K219 Gastro-esophageal reflux disease without esophagitis: Secondary | ICD-10-CM | POA: Insufficient documentation

## 2013-04-09 DIAGNOSIS — G2581 Restless legs syndrome: Secondary | ICD-10-CM

## 2013-04-09 DIAGNOSIS — F0280 Dementia in other diseases classified elsewhere without behavioral disturbance: Secondary | ICD-10-CM

## 2013-04-09 DIAGNOSIS — E039 Hypothyroidism, unspecified: Secondary | ICD-10-CM

## 2013-04-09 DIAGNOSIS — G309 Alzheimer's disease, unspecified: Secondary | ICD-10-CM

## 2013-04-09 NOTE — Progress Notes (Signed)
Patient ID: [name], female   DOB: [birth year], 80 y.o.   MRN: [id number]     [facility]  Code status - dnr  Allergies reviewed  HPI 80 y/o patient seen today for routine visit. Denies any complaints. She has been refusing all restorative activities. No other concerns from staff. No new skin concerns, no falls, weight has been stable. She is alert and oriented. No new behavioral issues except for her anxiety which is under control.  Review of Systems   Constitutional: Negative for fever and chills.   HENT: Negative for congestion.    Eyes: Negative for blurred vision.   Respiratory: Negative for cough and shortness of breath.    Cardiovascular: Negative for chest pain and palpitations.   Gastrointestinal: Negative for abdominal pain.   Musculoskeletal: Negative for falls.   Neurological: Negative for dizziness, seizures and headaches  Past Medical History  Diagnosis Date  . Stroke   . Arthritis   . Dementia   . Pneumonia   . Multiple falls   . Myocardial infarction   . Anginal pain     "once in awhile" (01/15/2012)  . Asthma   . Shortness of breath     "any time" (01/15/2012)  . Hypothyroidism   . Hypercholesteremia   . Hypertension   . Skin cancer     "arms, face, maybe on my right big toe" (01/15/2012)  . Dizzinesses   . On home oxygen therapy   . Depression   . COPD (chronic obstructive pulmonary disease)   . CAD (coronary artery disease)   . Depressive disorder, not elsewhere classified   . Allergic rhinitis   . Diastolic CHF   . Gout   . Other and unspecified hyperlipidemia   . PVD (peripheral vascular disease)   . RLS (restless legs syndrome)   . Osteoarthritis   . GERD (gastroesophageal reflux disease)   . Benign hypertensive heart disease with heart failure(402.11)   . Senile dementia, uncomplicated   . Major depressive disorder, recurrent episode, unspecified    Current Outpatient Prescriptions on File Prior to Visit  Medication Sig  Dispense Refill  . acetaminophen (TYLENOL) 325 MG tablet Take 2 tablets (650 mg total) by mouth every 6 (six) hours as needed for pain (or Fever >/= 101).      Marland Kitchen albuterol (PROVENTIL) (2.5 MG/3ML) 0.083% nebulizer solution Take 2.5 mg by nebulization every 4 (four) hours as needed. For shortness of breath      . allopurinol (ZYLOPRIM) 100 MG tablet Take 100 mg by mouth daily.      Marland Kitchen aspirin 81 MG tablet Take 81 mg by mouth daily.      . diazepam (VALIUM) 5 MG tablet Take one tablet by mouth every 12 hours as needed for anxiety  60 tablet  5  . divalproex (DEPAKOTE SPRINKLE) 125 MG capsule Take 250 mg by mouth 2 (two) times daily.      . furosemide (LASIX) 40 MG tablet Take 20 mg by mouth daily.       Marland Kitchen gabapentin (NEURONTIN) 300 MG capsule Take 300-600 mg by mouth 3 (three) times daily. Take 1 capsule by mouth three times daily for neuropathic pain.      Marland Kitchen HYDROcodone-acetaminophen (NORCO) 7.5-325 MG per tablet Take 1 tablet by mouth every 4 (four) hours as needed. For pain  180 tablet  0  . levothyroxine (SYNTHROID, LEVOTHROID) 50 MCG tablet Take 50 mcg by mouth daily. Take 1 tablet by mouth daily for  hypothyroidism, Check pulse weekly.      . metoprolol tartrate (LOPRESSOR) 25 MG tablet Take 25 mg by mouth daily.      Marland Kitchen omeprazole (PRILOSEC) 20 MG capsule Take 20 mg by mouth daily.      Marland Kitchen oxybutynin (DITROPAN) 5 MG tablet Take 5 mg by mouth daily.      . polyethylene glycol (MIRALAX / GLYCOLAX) packet Take 17 g by mouth daily.      . potassium chloride SA (K-DUR,KLOR-CON) 20 MEQ tablet Take 20 mEq by mouth daily. Take 1 tablet by mouth for supplement.      . risperiDONE (RISPERDAL) 1 MG tablet Take 1 mg by mouth at bedtime and may repeat dose one time if needed. Take 1 tablet by mouth every night at bedtime for psychosis.      Marland Kitchen rOPINIRole (REQUIP) 0.5 MG tablet Take 0.5 mg by mouth daily.      . simvastatin (ZOCOR) 10 MG tablet Take 10 mg by mouth at bedtime. Take 1 tablet by mouth every night  at bedtime for hypercholesteremia.      Marland Kitchen traMADol (ULTRAM) 50 MG tablet Take one tablet by mouth twice daily for osteoarthritis  60 tablet  5   No current facility-administered medications on file prior to visit.    Physical exam BP 100/60  Pulse 65  Temp(Src) 97.1 F (36.2 C)  Resp 18  SpO2 96%  Constitutional: She appears well-developed and well-nourished. No distress.   HEENT:   Head: Normocephalic and atraumatic.   Mouth/Throat: Oropharynx is clear and moist. No oropharyngeal exudate.   Eyes: Conjunctivae and EOM are normal. Pupils are equal, round, and reactive to light.   Neck: Normal range of motion. Neck supple.   Cardiovascular: Normal rate and regular rhythm.    Pulmonary/Chest: Effort normal and breath sounds normal. No respiratory distress. She exhibits no tenderness.   Abdominal: Soft. Bowel sounds are normal. She exhibits no mass.  Musculoskeletal: Normal range of motion in her knee joint, on a wheelchair. She exhibits no edema. No spinal tenderness. No paraspinal tenderness. Right shoulder ROM limited with pain Neurological: She is alert.   Skin: Skin is warm and dry. She is not diaphoretic.  Psychiatric: She has a normal mood and affect.   Labs reviewed 08/28/12 wbc 7.4, hb 11.6, hct 35.5, plt 220, na 135, k 4.1, cl 102, bun 18, cr 0.87, glu 116, ca 9.9 11/02/12 wbc 7.4, hb 10.7, hct 34.4, plt 205, na 132, k 4.4, bun 15, cr 0.6, tsh 2.3, lipid panel wnl, lft wnl 03/15/13 na 131, k 4.9, glu 96, bun 22, cr 0.8, ca 9.9, valproic acid 55, wbc 6.7, hb 11.3, plt 189   Assessment/plan  Generalized osteoarthrosis Persists but mproved with tramadol bid , prn norco. Continue vitamin d supplement   gerd Stable. Decrease prilosec to 10 mg daily and monitor   Gout No recent attack. Continue allopurinol  Insomnia On trazodone and tolerating this well, continue  OAB continue oxybutynin 5 mg daily for now  Copd Breathing stable this visit. Continue tudorza and prn  albuterol  CHF Stable. Continue lasix with kcl supplement.  Restless leg syndrome Continue neurontin 300 mg tid and requip, has been helpful  Bipolar disorder Continue depakote for now with risperidal and prn valium. Mood remains stable  alzhimer's dementia Continue aricept for now  Hypothyroidism Continue levothyroxine for now, monitor tsh periodically

## 2013-05-09 DEATH — deceased

## 2014-09-07 IMAGING — CT CT CERVICAL SPINE W/O CM
3 of 7 series · 11 of 33 positions shown, 13 images · non-contrast
Comparison: Head and cervical spine CT scan 09/06/2010 and head CT
scan 01/15/2012.

CT HEAD

CLINICAL DATA: Status post fall.

CT HEAD WITHOUT CONTRAST
CT CERVICAL SPINE WITHOUT CONTRAST
TECHNIQUE: Multidetector CT imaging of the head and cervical spine
was performed following the standard protocol without intravenous
contrast.  Multiplanar CT image reconstructions of the cervical
spine were also generated.

[Series 4: cervical spine · axial · 0.33mm/px · z∈[-257,-122]mm · 3 of 69 slices shown, 4 images]
[im 1/69  soft-tissue]
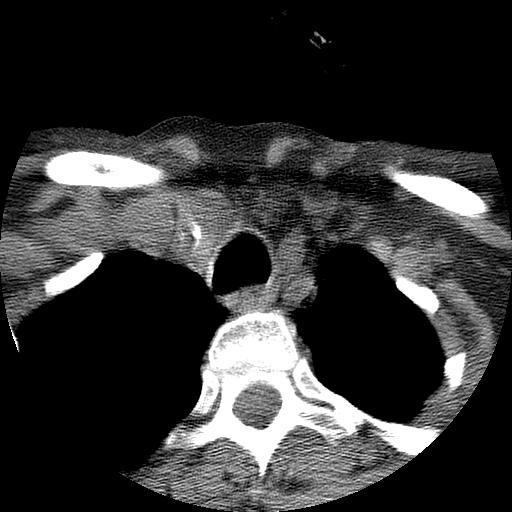
[im 1/69  bone]
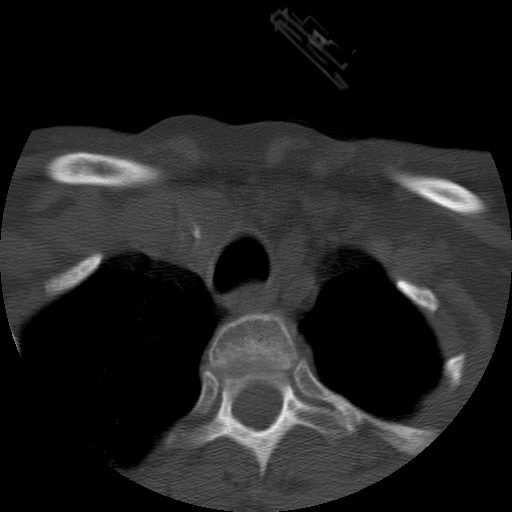
[im 35/69  bone]
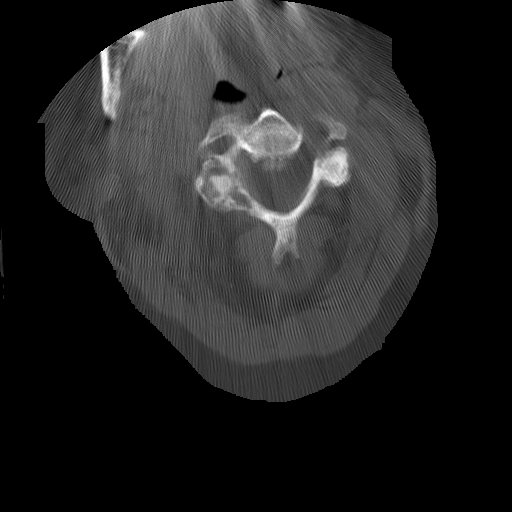
[im 69/69  bone]
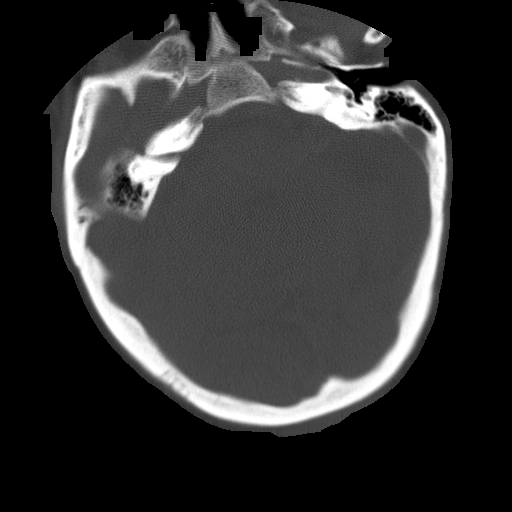

[Series 600: sagittals · sagittal · 0.33mm/px · 5 of 28 slices shown, 6 images]
[im 10/28  bone]
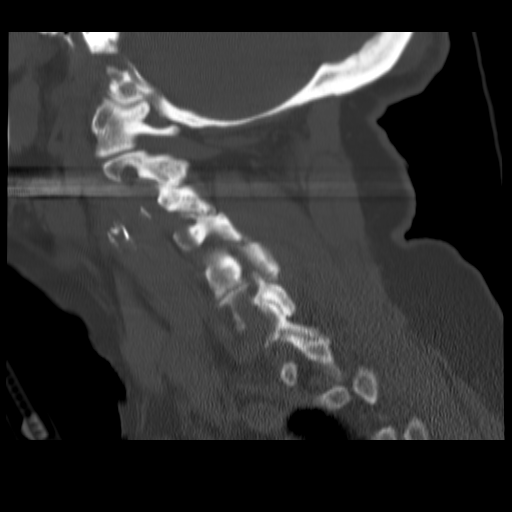
[im 12/28  bone]
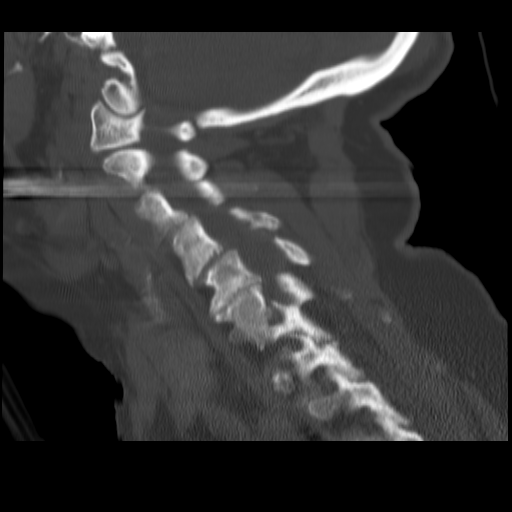
[im 14/28  soft-tissue]
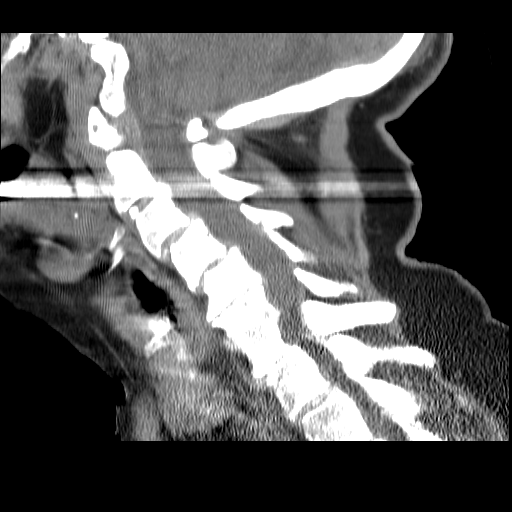
[im 14/28  bone]
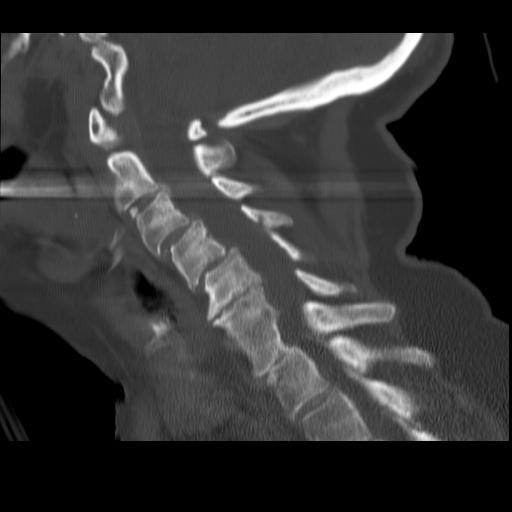
[im 16/28  bone]
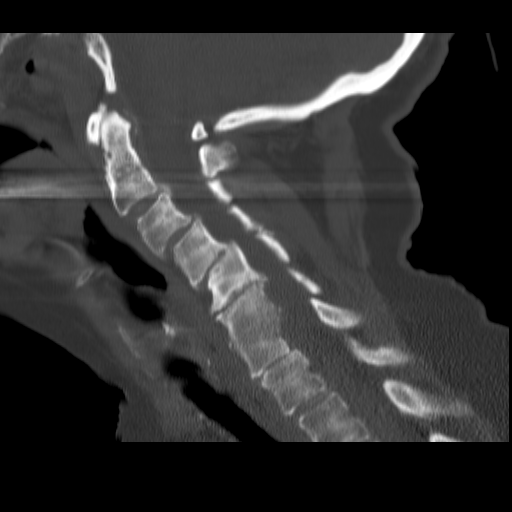
[im 19/28  bone]
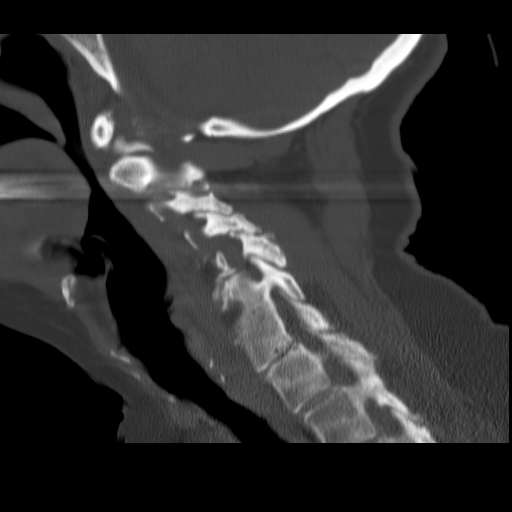

[Series 601: coronals · coronal · 0.33mm/px · 3 of 30 slices shown]
[im 6/30  bone]
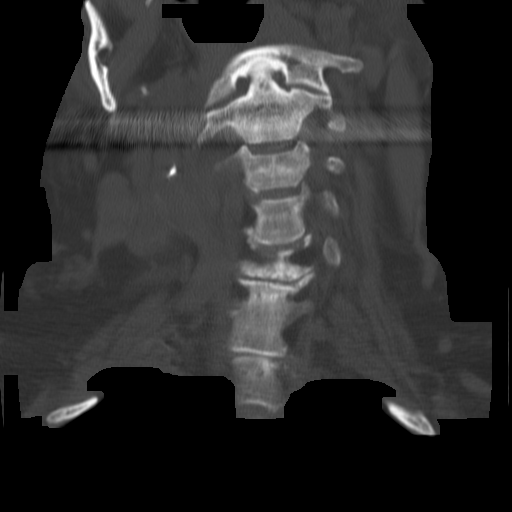
[im 12/30  bone]
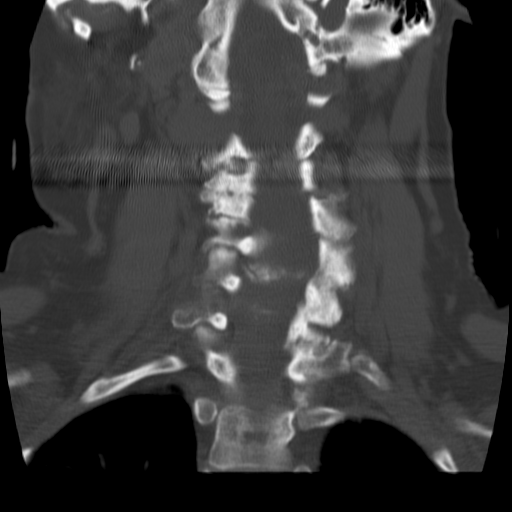
[im 18/30  bone]
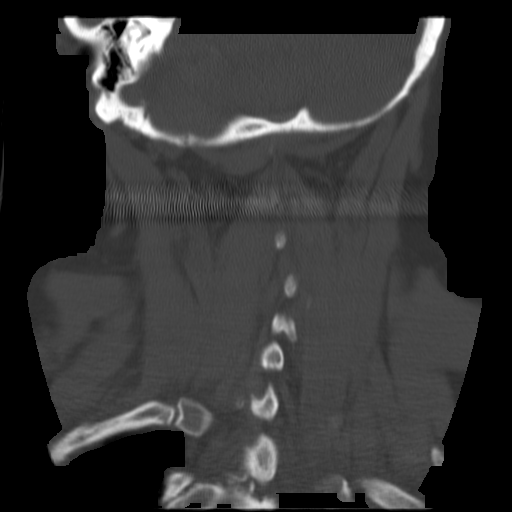

[11 of 33 positions shown; findings below may reference images not displayed]

FINDINGS: Cortical atrophy and chronic microvascular ischemic
change are again seen.  No evidence of acute abnormality including
infarct ridge, mass lesion, mass effect, midline shift or abnormal
extra-axial fluid collection is identified.  There is mild mucosal
thickening in the maxillary sinuses, greater on the right.
Scattered ethmoid air cell disease is also noted.  Atherosclerosis
is seen.  Calvarium intact.
IMPRESSION: No acute finding.

CT CERVICAL SPINE
FINDINGS: There is no fracture.  Facet mediated anterolisthesis of
C3 on C4 and C4 on C5 is unchanged.  C6-7 fusion is again
identified. Marked multilevel loss of disc space height appears
unchanged.  Paraspinous structures are unremarkable.  Lung apices
are clear.
IMPRESSION: No acute finding.  Marked multilevel degenerative change.

## 2015-01-26 IMAGING — CT CT HEAD W/O CM
1 of 2 series · 13 of 30 positions shown, 17 images · non-contrast
Comparison: Prior MRI and CT from 04/14/2012.

CLINICAL DATA: Altered mental status, history of dementia

CT HEAD WITHOUT CONTRAST
TECHNIQUE: Contiguous axial images were obtained from the base of
the skull through the vertex without contrast.

[Series 2: brain · axial · 0.47mm/px · z∈[+127,+266]mm · 13 of 32 slices shown, 17 images]
[im 3/32  brain]
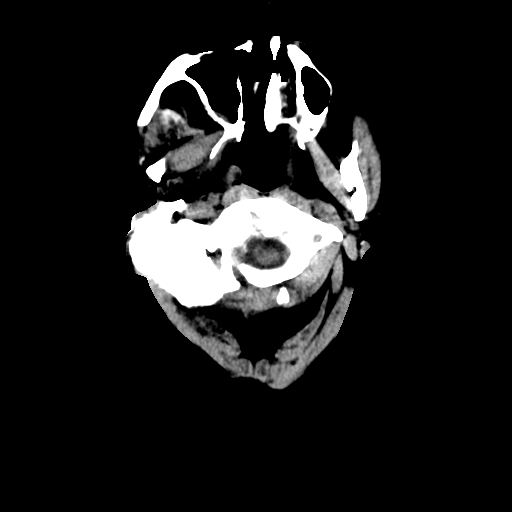
[im 3/32  bone]
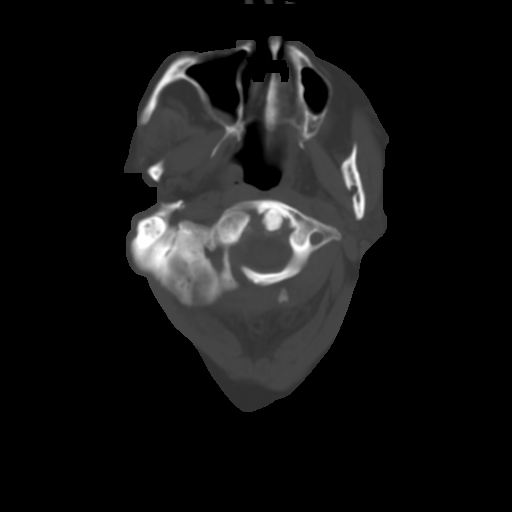
[im 5/32  brain]
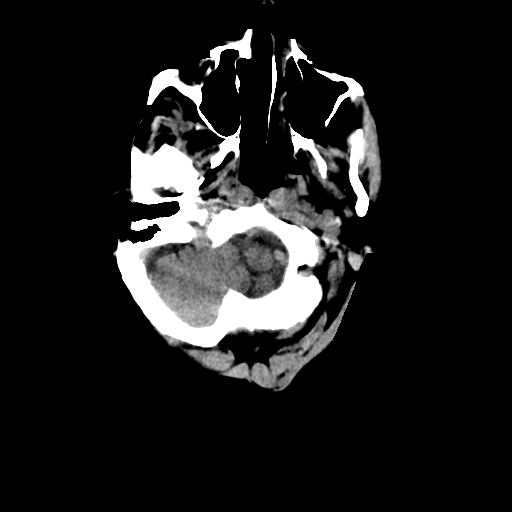
[im 7/32  brain]
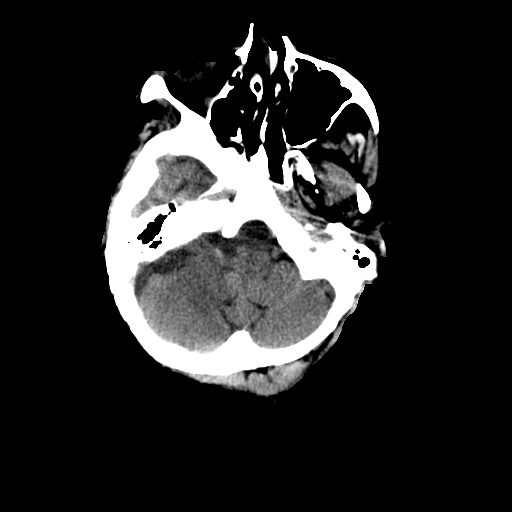
[im 9/32  brain]
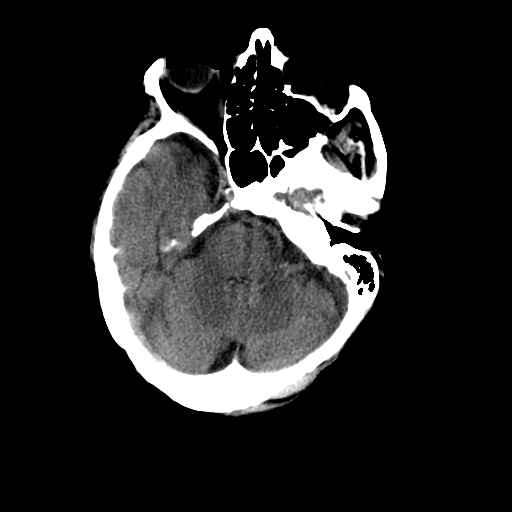
[im 12/32  brain]
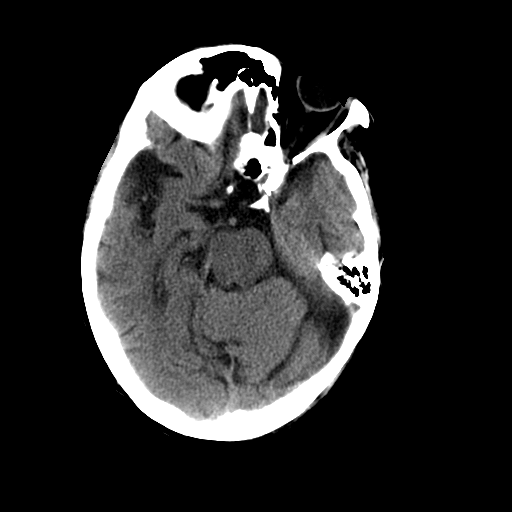
[im 12/32  bone]
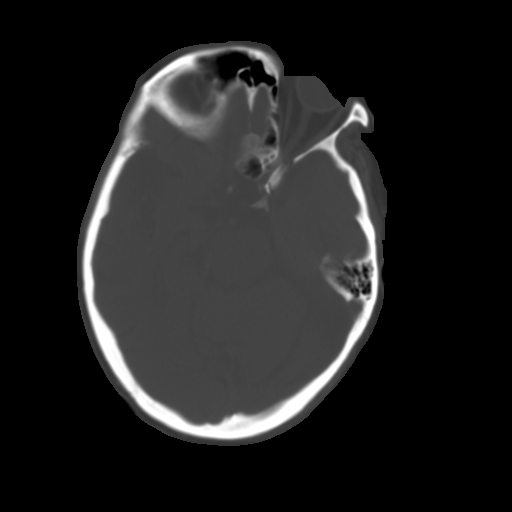
[im 14/32  brain]
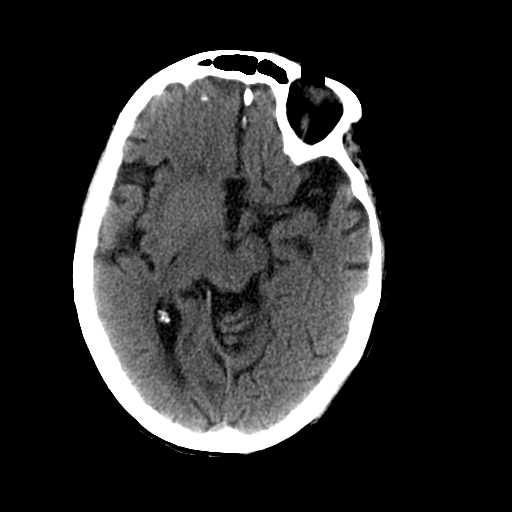
[im 16/32  brain]
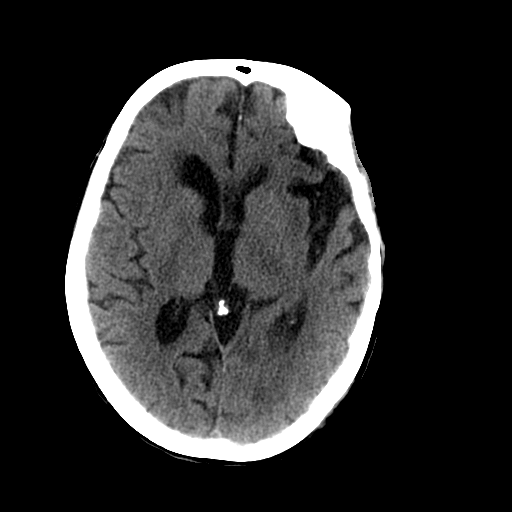
[im 18/32  brain]
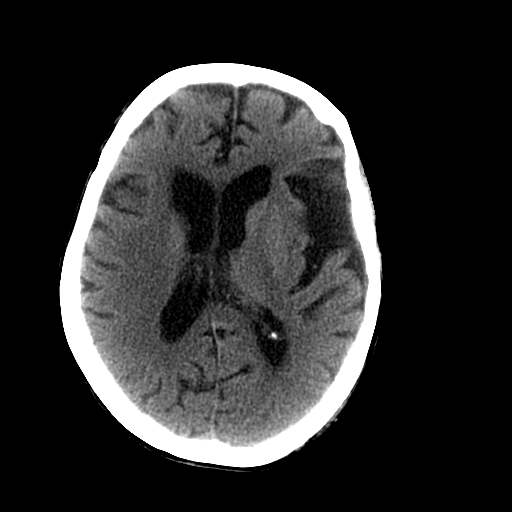
[im 20/32  brain]
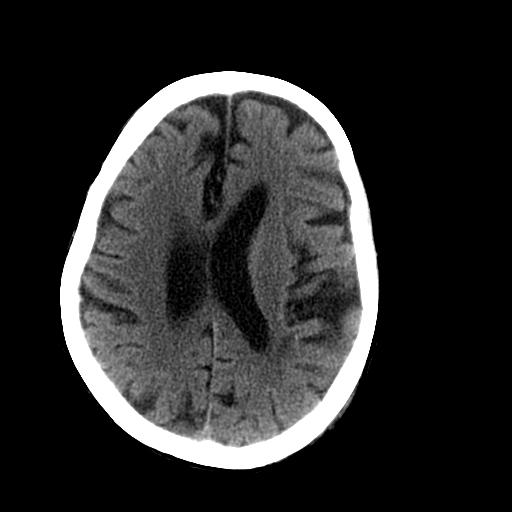
[im 20/32  bone]
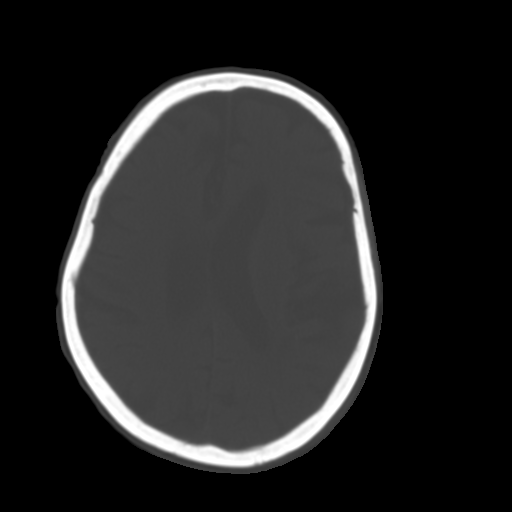
[im 23/32  brain]
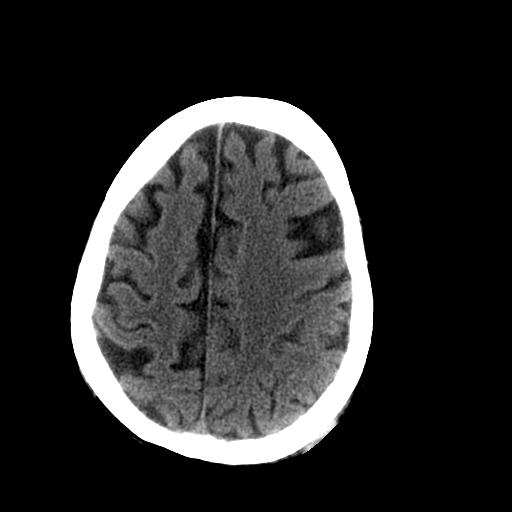
[im 25/32  brain]
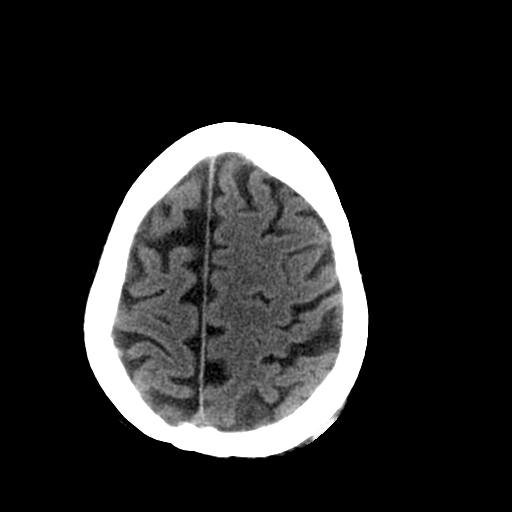
[im 27/32  brain]
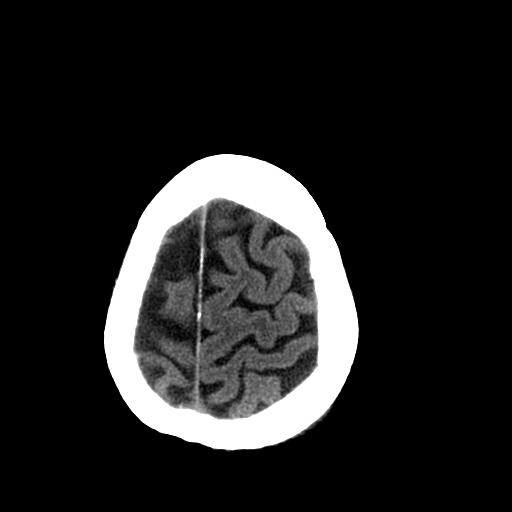
[im 29/32  brain]
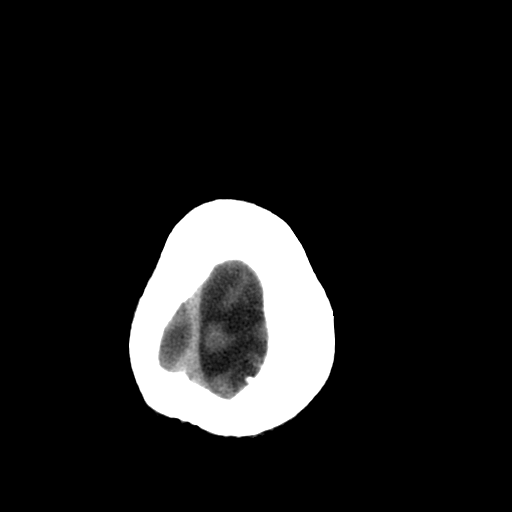
[im 29/32  bone]
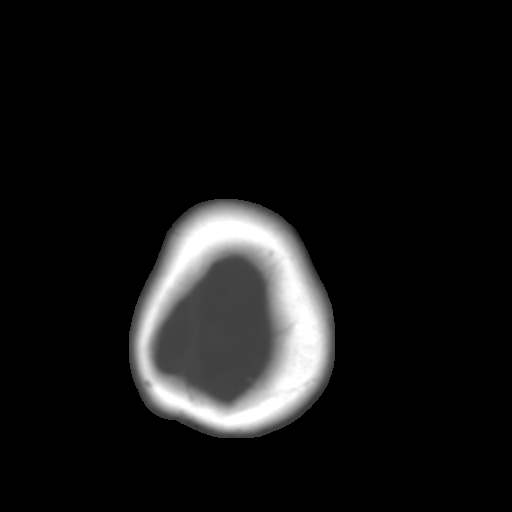

[13 of 30 positions shown; findings below may reference images not displayed]

FINDINGS: There is no acute intracranial hemorrhage or infarct.
Diffuse prominence of the CSF containing spaces consistent with
generalized atrophy, unchanged. Vessel ischemic changes are again
noted.  There is no midline shift or mass lesion.

Focus of soft tissue swelling is noted over the left parietal scalp
( series 2, image 24). No skull fracture is identified.
IMPRESSION: 1.  Stable appearance of the brain with no acute intracranial
abnormality.

2. Left parietal scalp contusion.
